# Patient Record
Sex: Male | Born: 1960 | Race: White | Hispanic: No | Marital: Married | Smoking: Former smoker
Health system: Southern US, Community
[De-identification: ages and names within clinical notes are randomized; demographics above are authoritative.]

## PROBLEM LIST (undated history)

## (undated) DIAGNOSIS — G2581 Restless legs syndrome: Secondary | ICD-10-CM

## (undated) DIAGNOSIS — E785 Hyperlipidemia, unspecified: Secondary | ICD-10-CM

## (undated) HISTORY — PX: HEMORRHOID SURGERY: SHX153

---

## 2004-05-16 ENCOUNTER — Observation Stay (HOSPITAL_COMMUNITY): Admission: EM | Admit: 2004-05-16 | Discharge: 2004-05-16 | Payer: Self-pay | Admitting: Emergency Medicine

## 2013-07-19 ENCOUNTER — Other Ambulatory Visit: Payer: Self-pay | Admitting: Family Medicine

## 2013-07-21 NOTE — Telephone Encounter (Signed)
Last seen 11/09/12  [facility]

## 2013-07-22 ENCOUNTER — Telehealth: Payer: Self-pay | Admitting: Family Medicine

## 2013-07-23 ENCOUNTER — Other Ambulatory Visit: Payer: Self-pay | Admitting: *Deleted

## 2013-07-23 MED ORDER — PRAMIPEXOLE DIHYDROCHLORIDE 1 MG PO TABS: 1.0000 mg | ORAL_TABLET | Freq: Three times a day (TID) | ORAL | Status: DC

## 2013-07-23 NOTE — Telephone Encounter (Signed)
done

## 2013-10-19 ENCOUNTER — Ambulatory Visit (INDEPENDENT_AMBULATORY_CARE_PROVIDER_SITE_OTHER): Payer: BC Managed Care – PPO | Admitting: General Practice

## 2013-10-19 ENCOUNTER — Encounter: Payer: Self-pay | Admitting: General Practice

## 2013-10-19 VITALS — BP 139/83 | HR 66 | Temp 98.3°F | Ht 72.0 in | Wt 232.5 lb

## 2013-10-19 DIAGNOSIS — G2581 Restless legs syndrome: Secondary | ICD-10-CM

## 2013-10-19 MED ORDER — PRAMIPEXOLE DIHYDROCHLORIDE 1 MG PO TABS: 1.0000 mg | ORAL_TABLET | Freq: Three times a day (TID) | ORAL | Status: DC

## 2013-10-19 NOTE — Progress Notes (Signed)
   Subjective:    Patient ID: Wesley Davenport, male    DOB: January 06, 1961, 52 y.o.   MRN: [id number]  HPI Patient presents today for follow up of restless leg syndrome. He reports taking medication as directed and symptoms are well controlled. He reports daily exercise and eating healthy.     Review of Systems  Constitutional: Negative for fever and chills.  Respiratory: Negative for chest tightness, shortness of breath and wheezing.   Cardiovascular: Negative for chest pain and palpitations.  Neurological: Negative for dizziness, weakness and headaches.       Objective:   Physical Exam  Constitutional: He is oriented to person, place, and time. He appears well-developed and well-nourished.  Cardiovascular: Normal rate, regular rhythm and normal heart sounds.   Pulmonary/Chest: Effort normal and breath sounds normal. No respiratory distress. He exhibits no tenderness.  Abdominal: Soft. Bowel sounds are normal. He exhibits no distension. There is no tenderness.  Neurological: He is alert and oriented to person, place, and time.  Skin: Skin is warm and dry.  Psychiatric: He has a normal mood and affect.          Assessment & Plan:  1. Restless leg syndrome, controlled - pramipexole (MIRAPEX) 1 MG tablet; Take 1 tablet (1 mg total) by mouth 3 (three) times daily.  Dispense: 90 tablet; Refill: 5 -Continue medications as prescribed -RTO for annual exam and labs in 3 months -Patient verbalized understanding Coralie Keens, FNP-C

## 2013-10-19 NOTE — Patient Instructions (Signed)
Restless Legs Syndrome Restless legs syndrome is a movement disorder. It may also be called a sensori-motor disorder.  CAUSES  No one knows what specifically causes restless legs syndrome, but it tends to run in families. It is also more common in people with low iron, in pregnancy, in people who need dialysis, and those with nerve damage (neuropathy).Some medications may make restless legs syndrome worse.Those medications include drugs to treat high blood pressure, some heart conditions, nausea, colds, allergies, and depression. SYMPTOMS Symptoms include uncomfortable sensations in the legs. These leg sensations are worse during periods of inactivity or rest. They are also worse while sitting or lying down. Individuals that have the disorder describe sensations in the legs that feel like:  Pulling.  Drawing.  Crawling.  Worming.  Boring.  Tingling.  Pins and needles.  Prickling.  Pain. The sensations are usually accompanied by an overwhelming urge to move the legs. Sudden muscle jerks may also occur. Movement provides temporary relief from the discomfort. In rare cases, the arms may also be affected. Symptoms may interfere with going to sleep (sleep onset insomnia). Restless legs syndrome may also be related to periodic limb movement disorder (PLMD). PLMD is another more common motor disorder. It also causes interrupted sleep. The symptoms from PLMD usually occur most often when you are awake. TREATMENT  Treatment for restless legs syndrome is symptomatic. This means that the symptoms are treated.   Massage and cold compresses may provide temporary relief.  Walk, stretch, or take a cold or hot bath.  Get regular exercise and a good night's sleep.  Avoid caffeine, alcohol, nicotine, and medications that can make it worse.  Do activities that provide mental stimulation like discussions, needlework, and video games. These may be helpful if you are not able to walk or  stretch. Some medications are effective in relieving the symptoms. However, many of these medications have side effects. Ask your caregiver about medications that may help your symptoms. Correcting iron deficiency may improve symptoms for some patients. Document Released: 10/18/2002 Document Revised: 01/20/2012 Document Reviewed: 01/24/2011 [employer] Patient Information 2014 [employer].  

## 2014-03-15 ENCOUNTER — Other Ambulatory Visit: Payer: Self-pay | Admitting: General Practice

## 2014-11-16 ENCOUNTER — Other Ambulatory Visit: Payer: Self-pay | Admitting: General Practice

## 2015-01-30 ENCOUNTER — Other Ambulatory Visit: Payer: Self-pay | Admitting: General Practice

## 2015-02-10 ENCOUNTER — Ambulatory Visit (INDEPENDENT_AMBULATORY_CARE_PROVIDER_SITE_OTHER): Payer: 59 | Admitting: Family Medicine

## 2015-02-10 VITALS — BP 136/82 | HR 67 | Temp 97.6°F | Ht 72.0 in | Wt 231.2 lb

## 2015-02-10 DIAGNOSIS — G2581 Restless legs syndrome: Secondary | ICD-10-CM | POA: Diagnosis not present

## 2015-02-10 MED ORDER — PRAMIPEXOLE DIHYDROCHLORIDE 1 MG PO TABS: 1.0000 mg | ORAL_TABLET | Freq: Three times a day (TID) | ORAL | Status: DC

## 2015-02-10 MED ORDER — CARBIDOPA-LEVODOPA ER 50-200 MG PO TBCR: 1.0000 | EXTENDED_RELEASE_TABLET | Freq: Two times a day (BID) | ORAL | Status: DC

## 2015-02-10 MED ORDER — MAGNESIUM 400 MG PO CAPS: 400.0000 mg | ORAL_CAPSULE | Freq: Two times a day (BID) | ORAL | Status: DC

## 2015-02-10 NOTE — Progress Notes (Signed)
Subjective:  Patient ID: Wesley Davenport, male    DOB: 1961/07/10  Age: 54 y.o. MRN: [id number]  CC: RLS   HPI Wesley Davenport presents for restless legs when sitting during day. No problem in morning. Mom has it.No relief with OTC & herbals. Mira due to drowsiness.pex works. Causes drowsiness. Using TID. Can't drive  History Wesley Davenport has no past medical history on file.   Wesley Davenport has past surgical history that includes Hemorrhoid surgery. herbal.  His family history includes Cancer in his mother; Heart disease in his brother and father.Wesley Davenport reports that Wesley Davenport has never smoked. Wesley Davenport does not have any smokeless tobacco history on file. Wesley Davenport reports that Wesley Davenport drinks alcohol. Wesley Davenport reports that Wesley Davenport does not use illicit drugs.  No current outpatient prescriptions on file prior to visit.   No current facility-administered medications on file prior to visit.    ROS Review of Systems  Constitutional: Negative for fever, chills, diaphoresis and unexpected weight change.  HENT: Negative for congestion, hearing loss, rhinorrhea, sore throat and trouble swallowing.   Respiratory: Negative for cough, chest tightness, shortness of breath and wheezing.   Gastrointestinal: Negative for nausea, vomiting, abdominal pain, diarrhea, constipation and abdominal distention.  Endocrine: Negative for cold intolerance and heat intolerance.  Genitourinary: Negative for dysuria, hematuria and flank pain.  Musculoskeletal: Negative for joint swelling and arthralgias.  Skin: Negative for rash.  Neurological: Negative for dizziness and headaches.  Psychiatric/Behavioral: Negative for dysphoric mood, decreased concentration and agitation. The patient is not nervous/anxious.     Objective:  BP 136/82 mmHg  Pulse 67  Temp(Src) 97.6 F (36.4 C) (Oral)  Ht 6' (1.829 m)  Wt 231 lb 3.2 oz (104.872 kg)  BMI 31.35 kg/m2  BP Readings from Last 3 Encounters:  02/10/15 136/82  10/19/13 139/83    Wt Readings from Last 3 Encounters:    02/10/15 231 lb 3.2 oz (104.872 kg)  10/19/13 232 lb 8 oz (105.461 kg)     Physical Exam  Constitutional: Wesley Davenport is oriented to person, place, and time. Wesley Davenport appears well-developed and well-nourished. No distress.  HENT:  Head: Normocephalic and atraumatic.  Right Ear: External ear normal.  Left Ear: External ear normal.  Nose: Nose normal.  Mouth/Throat: Oropharynx is clear and moist.  Eyes: Conjunctivae and EOM are normal. Pupils are equal, round, and reactive to light.  Neck: Normal range of motion. Neck supple. No thyromegaly present.  Cardiovascular: Normal rate, regular rhythm and normal heart sounds.   No murmur heard. Pulmonary/Chest: Effort normal and breath sounds normal. No respiratory distress. Wesley Davenport has no wheezes. Wesley Davenport has no rales.  Abdominal: Soft. Bowel sounds are normal. Wesley Davenport exhibits no distension. There is no tenderness.  Lymphadenopathy:    Wesley Davenport has no cervical adenopathy.  Neurological: Wesley Davenport is alert and oriented to person, place, and time. Wesley Davenport has normal reflexes.  Skin: Skin is warm and dry.  Psychiatric: Wesley Davenport has a normal mood and affect. His behavior is normal. Judgment and thought content normal.    No results found for: HGBA1C  No results found for: WBC, HGB, HCT, PLT, GLUCOSE, CHOL, TRIG, HDL, LDLDIRECT, LDLCALC, ALT, AST, NA, K, CL, CREATININE, BUN, CO2, TSH, PSA, INR, GLUF, HGBA1C, MICROALBUR  No results found.  Assessment & Plan:   Jair was seen today for rls.  Diagnoses and all orders for this visit:  Restless leg syndrome, controlled Orders: -     Discontinue: pramipexole (MIRAPEX) 1 MG tablet; Take 1 tablet (1 mg total) by mouth 3 (  three) times daily. -     Discontinue: pramipexole (MIRAPEX) 1 MG tablet; Take 1 tablet (1 mg total) by mouth 3 (three) times daily. -     pramipexole (MIRAPEX) 1 MG tablet; Take 1 tablet (1 mg total) by mouth 3 (three) times daily. -     CMP14+EGFR -     Magnesium  Other orders -     Magnesium 400 MG CAPS; Take 400 mg by  mouth 2 (two) times daily. -     carbidopa-levodopa (SINEMET CR) 50-200 MG per tablet; Take 1 tablet by mouth 2 (two) times daily.   I am having Wesley Davenport start on Magnesium and carbidopa-levodopa. I am also having him maintain his pramipexole.  Meds ordered this encounter  Medications  . DISCONTD: pramipexole (MIRAPEX) 1 MG tablet    Sig: Take 1 tablet (1 mg total) by mouth 3 (three) times daily.    Dispense:  90 tablet    Refill:  5  . Magnesium 400 MG CAPS    Sig: Take 400 mg by mouth 2 (two) times daily.    Dispense:  60 capsule    Refill:  11  . DISCONTD: pramipexole (MIRAPEX) 1 MG tablet    Sig: Take 1 tablet (1 mg total) by mouth 3 (three) times daily.    Dispense:  90 tablet    Refill:  5  . carbidopa-levodopa (SINEMET CR) 50-200 MG per tablet    Sig: Take 1 tablet by mouth 2 (two) times daily.    Dispense:  60 tablet    Refill:  5  . pramipexole (MIRAPEX) 1 MG tablet    Sig: Take 1 tablet (1 mg total) by mouth 3 (three) times daily.    Dispense:  90 tablet    Refill:  11     Follow-up: Return in about 1 year (around 02/10/2016) for CPE.  Claretta Fraise, M.D.

## 2015-02-11 ENCOUNTER — Telehealth: Payer: Self-pay | Admitting: Family Medicine

## 2015-02-11 LAB — CMP14+EGFR
ALK PHOS: 81 IU/L (ref 39–117)
ALT: 25 IU/L (ref 0–44)
AST: 20 IU/L (ref 0–40)
Albumin/Globulin Ratio: 1.8 (ref 1.1–2.5)
Albumin: 4.4 g/dL (ref 3.5–5.5)
BUN/Creatinine Ratio: 11 (ref 9–20)
BUN: 9 mg/dL (ref 6–24)
Bilirubin Total: 0.3 mg/dL (ref 0.0–1.2)
CO2: 23 mmol/L (ref 18–29)
Calcium: 9.2 mg/dL (ref 8.7–10.2)
Chloride: 101 mmol/L (ref 97–108)
Creatinine, Ser: 0.83 mg/dL (ref 0.76–1.27)
GFR calc non Af Amer: 99 mL/min/{1.73_m2} (ref >59)
GFR, EST AFRICAN AMERICAN: 115 mL/min/{1.73_m2} (ref >59)
Globulin, Total: 2.4 g/dL (ref 1.5–4.5)
Glucose: 102 mg/dL — ABNORMAL HIGH (ref 65–99)
POTASSIUM: 4.5 mmol/L (ref 3.5–5.2)
SODIUM: 140 mmol/L (ref 134–144)
Total Protein: 6.8 g/dL (ref 6.0–8.5)

## 2015-02-11 LAB — MAGNESIUM: Magnesium: 2.1 mg/dL (ref 1.6–2.3)

## 2015-02-11 NOTE — Telephone Encounter (Signed)
[facility] was wanting to change patients magnesium capsules to tablets ok per Christy to change.

## 2016-03-22 ENCOUNTER — Other Ambulatory Visit: Payer: Self-pay | Admitting: Family Medicine

## 2016-03-27 ENCOUNTER — Other Ambulatory Visit: Payer: Self-pay | Admitting: Family Medicine

## 2016-03-27 ENCOUNTER — Ambulatory Visit (INDEPENDENT_AMBULATORY_CARE_PROVIDER_SITE_OTHER): Payer: BLUE CROSS/BLUE SHIELD | Admitting: Family Medicine

## 2016-03-27 ENCOUNTER — Encounter: Payer: Self-pay | Admitting: Family Medicine

## 2016-03-27 VITALS — BP 140/83 | HR 82 | Temp 97.2°F | Ht 72.0 in | Wt 230.4 lb

## 2016-03-27 DIAGNOSIS — G2581 Restless legs syndrome: Secondary | ICD-10-CM | POA: Diagnosis not present

## 2016-03-27 MED ORDER — CARBIDOPA-LEVODOPA ER 50-200 MG PO TBCR: 1.0000 | EXTENDED_RELEASE_TABLET | Freq: Two times a day (BID) | ORAL | Status: DC

## 2016-03-27 MED ORDER — PRAMIPEXOLE DIHYDROCHLORIDE 1 MG PO TABS: 1.0000 mg | ORAL_TABLET | Freq: Three times a day (TID) | ORAL | Status: DC

## 2016-03-27 NOTE — Progress Notes (Signed)
   Subjective:  Patient ID: Wesley Davenport, male    DOB: 12/13/1960  Age: 55 y.o. MRN: [id number]  CC: RLS   HPI Wesley Davenport presents for still has symptoms. Getting in arms too. Does't want increased treatment. Feels he should tolerate as is to avoid side effects such as drowsiness. Mom  Has it and he saw she had to deal with it. He isopen to new treatmentsif something else comes on th emarket.   History Wesley Davenport has no past medical history on file.   He has past surgical history that includes Hemorrhoid surgery.   His family history includes Cancer in his mother; Heart disease in his brother and father.He reports that he has never smoked. He does not have any smokeless tobacco history on file. He reports that he drinks alcohol. He reports that he does not use illicit drugs.    ROS Review of Systems  Constitutional: Negative for fever, chills and diaphoresis.  HENT: Negative for rhinorrhea and sore throat.   Respiratory: Negative for cough and shortness of breath.   Cardiovascular: Negative for chest pain.  Gastrointestinal: Negative for abdominal pain.  Musculoskeletal: Positive for myalgias.  Skin: Negative for rash.  Neurological: Negative for weakness and headaches.    Objective:  BP 140/83 mmHg  Pulse 82  Temp(Src) 97.2 F (36.2 C) (Oral)  Ht 6' (1.829 m)  Wt 230 lb 6.4 oz (104.509 kg)  BMI 31.24 kg/m2  SpO2 96%  BP Readings from Last 3 Encounters:  03/27/16 140/83  02/10/15 136/82  10/19/13 139/83    Wt Readings from Last 3 Encounters:  03/27/16 230 lb 6.4 oz (104.509 kg)  02/10/15 231 lb 3.2 oz (104.872 kg)  10/19/13 232 lb 8 oz (105.461 kg)     Physical Exam  Constitutional: He appears well-developed and well-nourished.  HENT:  Head: Normocephalic and atraumatic.  Right Ear: Tympanic membrane and external ear normal. No decreased hearing is noted.  Left Ear: Tympanic membrane and external ear normal. No decreased hearing is noted.  Mouth/Throat: No  oropharyngeal exudate or posterior oropharyngeal erythema.  Eyes: Pupils are equal, round, and reactive to light.  Neck: Normal range of motion. Neck supple.  Cardiovascular: Normal rate and regular rhythm.   No murmur heard. Pulmonary/Chest: Breath sounds normal. No respiratory distress.  Abdominal: Soft. Bowel sounds are normal. He exhibits no mass. There is no tenderness.  Vitals reviewed.    Lab Results  Component Value Date   GLUCOSE 102* 02/10/2015   ALT 25 02/10/2015   AST 20 02/10/2015   NA 140 02/10/2015   K 4.5 02/10/2015   CL 101 02/10/2015   CREATININE 0.83 02/10/2015   BUN 9 02/10/2015   CO2 23 02/10/2015    No results found.  Assessment & Plan:   Wesley Davenport was seen today for rls.  Diagnoses and all orders for this visit:  Restless leg syndrome, controlled     I am having Wesley Davenport maintain his Magnesium, carbidopa-levodopa, and pramipexole.  No orders of the defined types were placed in this encounter.     Follow-up: Return in about 1 year (around 03/27/2017) for CPE.  Wesley Davenport, M.D.

## 2016-04-12 ENCOUNTER — Ambulatory Visit (INDEPENDENT_AMBULATORY_CARE_PROVIDER_SITE_OTHER): Payer: BLUE CROSS/BLUE SHIELD | Admitting: Family

## 2016-04-12 ENCOUNTER — Encounter: Payer: Self-pay | Admitting: Family

## 2016-04-12 VITALS — BP 131/84 | HR 74 | Temp 98.9°F | Ht 72.0 in | Wt 225.8 lb

## 2016-04-12 DIAGNOSIS — J209 Acute bronchitis, unspecified: Secondary | ICD-10-CM | POA: Diagnosis not present

## 2016-04-12 MED ORDER — FLUTICASONE PROPIONATE 50 MCG/ACT NA SUSP: 2.0000 | Freq: Every day | NASAL | Status: DC

## 2016-04-12 MED ORDER — AZITHROMYCIN 250 MG PO TABS: ORAL_TABLET | ORAL | Status: DC

## 2016-04-12 NOTE — Patient Instructions (Addendum)
Acute Bronchitis Bronchitis is inflammation of the airways that extend from the windpipe into the lungs (bronchi). The inflammation often causes mucus to develop. This leads to a cough, which is the most common symptom of bronchitis.  In acute bronchitis, the condition usually develops suddenly and goes away over time, usually in a couple weeks. Smoking, allergies, and asthma can make bronchitis worse. Repeated episodes of bronchitis may cause further lung problems.  CAUSES Acute bronchitis is most often caused by the same virus that causes a cold. The virus can spread from person to person (contagious) through coughing, sneezing, and touching contaminated objects. SIGNS AND SYMPTOMS   Cough.   Fever.   Coughing up mucus.   Body aches.   Chest congestion.   Chills.   Shortness of breath.   Sore throat.  DIAGNOSIS  Acute bronchitis is usually diagnosed through a physical exam. Your health care provider will also ask you questions about your medical history. Tests, such as chest X-rays, are sometimes done to rule out other conditions.  TREATMENT  Acute bronchitis usually goes away in a couple weeks. Oftentimes, no medical treatment is necessary. Medicines are sometimes given for relief of fever or cough. Antibiotic medicines are usually not needed but may be prescribed in certain situations. In some cases, an inhaler may be recommended to help reduce shortness of breath and control the cough. A cool mist vaporizer may also be used to help thin bronchial secretions and make it easier to clear the chest.  HOME CARE INSTRUCTIONS  Get plenty of rest.   Drink enough fluids to keep your urine clear or pale yellow (unless you have a medical condition that requires fluid restriction). Increasing fluids may help thin your respiratory secretions (sputum) and reduce chest congestion, and it will prevent dehydration.   Take medicines only as directed by your health care provider.  If  you were prescribed an antibiotic medicine, finish it all even if you start to feel better.  Avoid smoking and secondhand smoke. Exposure to cigarette smoke or irritating chemicals will make bronchitis worse. If you are a smoker, consider using nicotine gum or skin patches to help control withdrawal symptoms. Quitting smoking will help your lungs heal faster.   Reduce the chances of another bout of acute bronchitis by washing your hands frequently, avoiding people with cold symptoms, and trying not to touch your hands to your mouth, nose, or eyes.   Keep all follow-up visits as directed by your health care provider.  SEEK MEDICAL CARE IF: Your symptoms do not improve after 1 week of treatment.  SEEK IMMEDIATE MEDICAL CARE IF:  You develop an increased fever or chills.   You have chest pain.   You have severe shortness of breath.  You have bloody sputum.   You develop dehydration.  You faint or repeatedly feel like you are going to pass out.  You develop repeated vomiting.  You develop a severe headache. MAKE SURE YOU:   Understand these instructions.  Will watch your condition.  Will get help right away if you are not doing well or get worse.   This information is not intended to replace advice given to you by your health care provider. Make sure you discuss any questions you have with your health care provider.   Document Released: 12/05/2004 Document Revised: 11/18/2014 Document Reviewed: 04/20/2013 Elsevier Interactive Patient Education 2016 [employer]  - Take meds as prescribed - Use a cool mist humidifier  -Use saline nose sprays frequently -Saline   irrigations of the nose can be very helpful if done frequently.  * 4X daily for 1 week*  * Use of a nettie pot can be helpful with this. Follow directions with this* -Force fluids -For any cough or congestion  Use plain Mucinex- regular strength or max strength is fine   * Children- consult with Pharmacist for  dosing -For fever or aces or pains- take tylenol or ibuprofen appropriate for age and weight.  * for fevers greater than 101 orally you may alternate ibuprofen and tylenol every  3 hours. -Throat lozenges if help    Jeliyah Middlebrooks, FNP  

## 2016-04-12 NOTE — Progress Notes (Signed)
   Subjective:    Patient ID: Wesley Davenport, male    DOB: [birth year], 55 y.o.   MRN: [id number]  Cough This is a new problem. The current episode started 1 to 4 weeks ago. The problem has been gradually worsening. The problem occurs every few minutes. The cough is productive of sputum. Associated symptoms include nasal congestion, postnasal drip, rhinorrhea, a sore throat, shortness of breath and wheezing. Pertinent negatives include no ear congestion, ear pain, fever or myalgias. The symptoms are aggravated by lying down. He has tried rest and OTC cough suppressant for the symptoms. The treatment provided mild relief. There is no history of asthma or COPD.      Review of Systems  Constitutional: Negative for fever.  HENT: Positive for postnasal drip, rhinorrhea and sore throat. Negative for ear pain.   Respiratory: Positive for cough, shortness of breath and wheezing.   Musculoskeletal: Negative for myalgias.  All other systems reviewed and are negative.      Objective:   Physical Exam  Constitutional: He is oriented to person, place, and time. He appears well-developed and well-nourished. No distress.  HENT:  Head: Normocephalic.  Right Ear: External ear normal.  Left Ear: External ear normal.  Nose: Rhinorrhea present.  Mouth/Throat: Oropharynx is clear and moist.  Eyes: Pupils are equal, round, and reactive to light. Right eye exhibits no discharge. Left eye exhibits no discharge.  Neck: Normal range of motion. Neck supple. No thyromegaly present.  Cardiovascular: Normal rate, regular rhythm, normal heart sounds and intact distal pulses.   No murmur heard. Pulmonary/Chest: Effort normal and breath sounds normal. No respiratory distress. He has no wheezes.  Coarse nonproductive cough   Abdominal: Soft. Bowel sounds are normal. He exhibits no distension. There is no tenderness.  Musculoskeletal: Normal range of motion. He exhibits no edema or tenderness.  Neurological: He is  alert and oriented to person, place, and time. He has normal reflexes. No cranial nerve deficit.  Skin: Skin is warm and dry. No rash noted. No erythema.  Psychiatric: He has a normal mood and affect. His behavior is normal. Judgment and thought content normal.  Vitals reviewed.     BP 131/84 mmHg  Pulse 74  Temp(Src) 98.9 F (37.2 C) (Oral)  Ht 6' (1.829 m)  Wt 225 lb 12.8 oz (102.422 kg)  BMI 30.62 kg/m2     Assessment & Plan:  1. Acute bronchitis, unspecified organism -- Take meds as prescribed - Use a cool mist humidifier  -Use saline nose sprays frequently -Saline irrigations of the nose can be very helpful if done frequently.  * 4X daily for 1 week*  * Use of a nettie pot can be helpful with this. Follow directions with this* -Force fluids -For any cough or congestion  Use plain Mucinex- regular strength or max strength is fine   * Children- consult with Pharmacist for dosing -For fever or aces or pains- take tylenol or ibuprofen appropriate for age and weight.  * for fevers greater than 101 orally you may alternate ibuprofen and tylenol every  3 hours. -Throat lozenges if helps - azithromycin (ZITHROMAX Z-PAK) 250 MG tablet; As directed  Dispense: 1 each; Refill: 0 - fluticasone (FLONASE) 50 MCG/ACT nasal spray; Place 2 sprays into both nostrils daily.  Dispense: 16 g; Refill: 6  Jannifer Rodneyhristy Amrie Gurganus, FNP

## 2016-04-22 ENCOUNTER — Ambulatory Visit (INDEPENDENT_AMBULATORY_CARE_PROVIDER_SITE_OTHER): Payer: BLUE CROSS/BLUE SHIELD | Admitting: Family Medicine

## 2016-04-22 ENCOUNTER — Encounter: Payer: Self-pay | Admitting: Family Medicine

## 2016-04-22 VITALS — BP 128/80 | HR 70 | Temp 98.4°F | Ht 72.0 in | Wt 225.0 lb

## 2016-04-22 DIAGNOSIS — J209 Acute bronchitis, unspecified: Secondary | ICD-10-CM | POA: Diagnosis not present

## 2016-04-22 MED ORDER — LEVOFLOXACIN 500 MG PO TABS: 500.0000 mg | ORAL_TABLET | Freq: Every day | ORAL | Status: DC

## 2016-04-22 NOTE — Addendum Note (Signed)
Addended by: Magdalene RiverBULLINS, Shreena Baines H on: 04/22/2016 02:50 PM   Modules accepted: Orders

## 2016-04-22 NOTE — Progress Notes (Signed)
   Subjective:    Patient ID: Wesley Davenport, male    DOB: [birth year], 55 y.o.   MRN: [id number]  HPI Patient here today for continued cough and congestion. He has completes a antibiotic and is not feeling any better. He took a Z-Pak and continues to take Mucinex and Flonase. Cough is mostly productive.    There are no active problems to display for this patient.  Outpatient Encounter Prescriptions as of 04/22/2016  Medication Sig  . carbidopa-levodopa (SINEMET CR) 50-200 MG tablet Take 1 tablet by mouth 2 (two) times daily.  . fluticasone (FLONASE) 50 MCG/ACT nasal spray Place 2 sprays into both nostrils daily.  . Magnesium 400 MG CAPS Take 400 mg by mouth 2 (two) times daily.  . pramipexole (MIRAPEX) 1 MG tablet Take 1 tablet (1 mg total) by mouth 3 (three) times daily.  . [DISCONTINUED] azithromycin (ZITHROMAX Z-PAK) 250 MG tablet As directed   No facility-administered encounter medications on file as of 04/22/2016.      Review of Systems  Constitutional: Positive for fatigue. Negative for fever.  HENT: Positive for congestion (chest and nasal).   Eyes: Negative.   Respiratory: Positive for cough.   Cardiovascular: Negative.   Gastrointestinal: Negative.   Endocrine: Negative.   Genitourinary: Negative.   Musculoskeletal: Negative.   Skin: Negative.   Allergic/Immunologic: Negative.   Neurological: Negative.   Hematological: Negative.   Psychiatric/Behavioral: Negative.        Objective:   Physical Exam  Constitutional: He is oriented to person, place, and time. He appears well-developed and well-nourished.  HENT:  Sinuses are nontender  Cardiovascular: Normal rate, regular rhythm and normal heart sounds.   Pulmonary/Chest: Effort normal. He has wheezes.  Neurological: He is alert and oriented to person, place, and time.  Psychiatric: He has a normal mood and affect. His behavior is normal.   BP 128/80 mmHg  Pulse 70  Temp(Src) 98.4 F (36.9 C) (Oral)  Ht 6'  (1.829 m)  Wt 225 lb (102.059 kg)  BMI 30.51 kg/m2  SpO2 94%        Assessment & Plan:  1. Acute bronchitis, unspecified organism By history, no improvement on Zithromax. We may not be dealing with a bacterial infection but will try a 5 day course of Levaquin 500 mg. Also sample of inhaler with steroids in case there is inflammation and is bronchial system. Continue Mucinex and drink lots of fluids  Frederica KusterStephen M Ananda Sitzer MD

## 2017-05-26 ENCOUNTER — Other Ambulatory Visit: Payer: Self-pay | Admitting: Family Medicine

## 2017-05-26 DIAGNOSIS — G2581 Restless legs syndrome: Secondary | ICD-10-CM

## 2017-05-29 ENCOUNTER — Other Ambulatory Visit: Payer: Self-pay | Admitting: Family Medicine

## 2017-05-29 DIAGNOSIS — G2581 Restless legs syndrome: Secondary | ICD-10-CM

## 2017-06-02 ENCOUNTER — Other Ambulatory Visit: Payer: Self-pay | Admitting: Family Medicine

## 2017-06-02 DIAGNOSIS — G2581 Restless legs syndrome: Secondary | ICD-10-CM

## 2017-08-13 ENCOUNTER — Ambulatory Visit (INDEPENDENT_AMBULATORY_CARE_PROVIDER_SITE_OTHER): Payer: BLUE CROSS/BLUE SHIELD | Admitting: Family Medicine

## 2017-08-13 ENCOUNTER — Encounter: Payer: Self-pay | Admitting: Family Medicine

## 2017-08-13 VITALS — BP 117/67 | HR 80 | Temp 98.2°F | Ht 72.0 in | Wt 227.0 lb

## 2017-08-13 DIAGNOSIS — Z125 Encounter for screening for malignant neoplasm of prostate: Secondary | ICD-10-CM | POA: Diagnosis not present

## 2017-08-13 DIAGNOSIS — F17210 Nicotine dependence, cigarettes, uncomplicated: Secondary | ICD-10-CM

## 2017-08-13 DIAGNOSIS — R6882 Decreased libido: Secondary | ICD-10-CM | POA: Diagnosis not present

## 2017-08-13 DIAGNOSIS — G2581 Restless legs syndrome: Secondary | ICD-10-CM | POA: Diagnosis not present

## 2017-08-13 DIAGNOSIS — Z1159 Encounter for screening for other viral diseases: Secondary | ICD-10-CM | POA: Diagnosis not present

## 2017-08-13 DIAGNOSIS — Z13220 Encounter for screening for lipoid disorders: Secondary | ICD-10-CM | POA: Diagnosis not present

## 2017-08-13 MED ORDER — PRAMIPEXOLE DIHYDROCHLORIDE 1 MG PO TABS: 1.0000 mg | ORAL_TABLET | Freq: Three times a day (TID) | ORAL | 1 refills | Status: DC

## 2017-08-13 MED ORDER — VARENICLINE TARTRATE 0.5 MG X 11 & 1 MG X 42 PO MISC: ORAL | 0 refills | Status: DC

## 2017-08-13 MED ORDER — CARBIDOPA-LEVODOPA ER 50-200 MG PO TBCR: 1.0000 | EXTENDED_RELEASE_TABLET | Freq: Two times a day (BID) | ORAL | 1 refills | Status: DC

## 2017-08-13 NOTE — Progress Notes (Signed)
Subjective:  Patient ID: Wesley Davenport, male    DOB: [birth year]  Age: 56 y.o. MRN: [id number]  CC: Medication Management (pt here today for routine follow up on Restless leg syndrome and also to discuss Chantix as he would like to quit smoking.)   HPI Wesley Davenport presents for Follow-up on his restless leg syndrome. He still having some symptoms but they're controllable. He says he doesn't really have pain but he does have cramping every few days or so. It does not interfere with activities nor does it keep him awake at night. He has some questions about switching to gabapentin. He was told by a doctor friend that it might work better. On the other hand he is satisfied with his treatment currently and does not necessarily want to change. Additionally patient would like to stop smoking and is interested in using Chantix.  Patient is also concerned that his libido has been falling off. His wife has been commenting to in and he would like to be checked. He is considering use of Viagra but the problem is not lack of erections as much as lack of desire.  Patient has never been checked for hepatitis C. He also is overdue for a PSA level. Those will be checked today as well.  Depression screen [facility] 2/9 08/13/2017 04/22/2016 04/12/2016  Decreased Interest 0 0 0  Down, Depressed, Hopeless 0 0 0  PHQ - 2 Score 0 0 0    History [facility] has no past medical history on file.   He has a past surgical history that includes Hemorrhoid surgery.   His family history includes Cancer in his mother; Heart disease in his brother and father.He reports that he has been smoking.  He has never used smokeless tobacco. He reports that he drinks alcohol. He reports that he does not use drugs.    ROS Review of Systems  Constitutional: Negative for chills, diaphoresis, fever and unexpected weight change.  HENT: Negative for congestion, hearing loss, rhinorrhea and sore throat.   Eyes: Negative for visual disturbance.    Respiratory: Negative for cough and shortness of breath.   Cardiovascular: Negative for chest pain.  Gastrointestinal: Negative for abdominal pain, constipation and diarrhea.  Genitourinary: Negative for dysuria and flank pain.  Musculoskeletal: Negative for arthralgias and joint swelling.  Skin: Negative for rash.  Neurological: Negative for dizziness and headaches.  Psychiatric/Behavioral: Negative for dysphoric mood and sleep disturbance.    Objective:  BP 117/67   Pulse 80   Temp 98.2 F (36.8 C) (Oral)   Ht 6' (1.829 m)   Wt 227 lb (103 kg)   BMI 30.79 kg/m   BP Readings from Last 3 Encounters:  08/13/17 117/67  04/22/16 128/80  04/12/16 131/84    Wt Readings from Last 3 Encounters:  08/13/17 227 lb (103 kg)  04/22/16 225 lb (102.1 kg)  04/12/16 225 lb 12.8 oz (102.4 kg)     Physical Exam  Constitutional: He is oriented to person, place, and time. He appears well-developed and well-nourished. No distress.  HENT:  Head: Normocephalic and atraumatic.  Right Ear: External ear normal.  Left Ear: External ear normal.  Nose: Nose normal.  Mouth/Throat: Oropharynx is clear and moist.  Eyes: Pupils are equal, round, and reactive to light. Conjunctivae and EOM are normal.  Neck: Normal range of motion. Neck supple. No thyromegaly present.  Cardiovascular: Normal rate, regular rhythm and normal heart sounds.   No murmur heard. Pulmonary/Chest: Effort normal and breath sounds normal.  No respiratory distress. He has no wheezes. He has no rales.  Abdominal: Soft. Bowel sounds are normal. He exhibits no distension. There is no tenderness.  Lymphadenopathy:    He has no cervical adenopathy.  Neurological: He is alert and oriented to person, place, and time. He has normal reflexes.  Skin: Skin is warm and dry.  Psychiatric: He has a normal mood and affect. His behavior is normal. Judgment and thought content normal.      Assessment & Plan:   Wesley Davenport was seen today for  medication management.  Diagnoses and all orders for this visit:  Need for hepatitis C screening test -     Hepatitis C antibody  Restless leg syndrome, controlled -     CBC with Differential/Platelet -     CMP14+EGFR -     pramipexole (MIRAPEX) 1 MG tablet; Take 1 tablet (1 mg total) by mouth 3 (three) times daily. -     carbidopa-levodopa (SINEMET CR) 50-200 MG tablet; Take 1 tablet by mouth 2 (two) times daily.  Decreased libido -     CBC with Differential/Platelet -     CMP14+EGFR -     Testosterone,Free and Total  Screening for prostate cancer -     PSA, total and free  Screening for cholesterol level -     Lipid panel  Tobacco dependence due to cigarettes -     varenicline (CHANTIX STARTING MONTH PAK) 0.5 MG X 11 & 1 MG X 42 tablet; Use according to package directions       I have discontinued Wesley Davenport's Magnesium, fluticasone, and levofloxacin. I have also changed his pramipexole and carbidopa-levodopa. Additionally, I am having him start on varenicline.  Allergies as of 08/13/2017   No Known Allergies     Medication List       Accurate as of 08/13/17  6:06 PM. Always use your most recent med list.          carbidopa-levodopa 50-200 MG tablet Commonly known as:  SINEMET CR Take 1 tablet by mouth 2 (two) times daily.   pramipexole 1 MG tablet Commonly known as:  MIRAPEX Take 1 tablet (1 mg total) by mouth 3 (three) times daily.   varenicline 0.5 MG X 11 & 1 MG X 42 tablet Commonly known as:  CHANTIX STARTING MONTH PAK Use according to package directions        Follow-up: Return in about 6 months (around 02/11/2018).  Claretta Fraise, M.D.

## 2017-08-14 LAB — CBC WITH DIFFERENTIAL/PLATELET
Basophils Absolute: 0 10*3/uL (ref 0.0–0.2)
Basos: 0 %
EOS (ABSOLUTE): 0.2 10*3/uL (ref 0.0–0.4)
EOS: 2 %
HEMOGLOBIN: 16.6 g/dL (ref 13.0–17.7)
Hematocrit: 50.3 % (ref 37.5–51.0)
IMMATURE GRANS (ABS): 0.1 10*3/uL (ref 0.0–0.1)
Immature Granulocytes: 1 %
LYMPHS ABS: 2.4 10*3/uL (ref 0.7–3.1)
LYMPHS: 22 %
MCH: 30.5 pg (ref 26.6–33.0)
MCHC: 33 g/dL (ref 31.5–35.7)
MCV: 92 fL (ref 79–97)
MONOCYTES: 9 %
Monocytes Absolute: 1 10*3/uL — ABNORMAL HIGH (ref 0.1–0.9)
NEUTROS ABS: 7.2 10*3/uL — AB (ref 1.4–7.0)
Neutrophils: 66 %
Platelets: 277 10*3/uL (ref 150–379)
RBC: 5.45 x10E6/uL (ref 4.14–5.80)
RDW: 14.1 % (ref 12.3–15.4)
WBC: 10.8 10*3/uL (ref 3.4–10.8)

## 2017-08-14 LAB — TESTOSTERONE,FREE AND TOTAL
TESTOSTERONE FREE: 7.9 pg/mL (ref 7.2–24.0)
Testosterone: 502 ng/dL (ref 264–916)

## 2017-08-14 LAB — CMP14+EGFR
ALK PHOS: 76 IU/L (ref 39–117)
ALT: 31 IU/L (ref 0–44)
AST: 24 IU/L (ref 0–40)
Albumin/Globulin Ratio: 1.7 (ref 1.2–2.2)
Albumin: 4.3 g/dL (ref 3.5–5.5)
BUN/Creatinine Ratio: 9 (ref 9–20)
BUN: 9 mg/dL (ref 6–24)
Bilirubin Total: 0.3 mg/dL (ref 0.0–1.2)
CO2: 24 mmol/L (ref 20–29)
CREATININE: 0.98 mg/dL (ref 0.76–1.27)
Calcium: 9.6 mg/dL (ref 8.7–10.2)
Chloride: 104 mmol/L (ref 96–106)
GFR calc Af Amer: 98 mL/min/{1.73_m2} (ref >59)
GFR calc non Af Amer: 85 mL/min/{1.73_m2} (ref >59)
GLUCOSE: 77 mg/dL (ref 65–99)
Globulin, Total: 2.6 g/dL (ref 1.5–4.5)
Potassium: 4.8 mmol/L (ref 3.5–5.2)
Sodium: 142 mmol/L (ref 134–144)
Total Protein: 6.9 g/dL (ref 6.0–8.5)

## 2017-08-14 LAB — PSA, TOTAL AND FREE
PSA FREE: 0.13 ng/mL
PSA, Free Pct: 14.4 %
Prostate Specific Ag, Serum: 0.9 ng/mL (ref 0.0–4.0)

## 2017-08-14 LAB — LIPID PANEL
CHOLESTEROL TOTAL: 205 mg/dL — AB (ref 100–199)
Chol/HDL Ratio: 4.4 ratio (ref 0.0–5.0)
HDL: 47 mg/dL (ref >39)
LDL CALC: 142 mg/dL — AB (ref 0–99)
TRIGLYCERIDES: 79 mg/dL (ref 0–149)
VLDL CHOLESTEROL CAL: 16 mg/dL (ref 5–40)

## 2017-08-14 LAB — HEPATITIS C ANTIBODY

## 2017-08-29 ENCOUNTER — Telehealth: Payer: Self-pay | Admitting: Family Medicine

## 2017-08-29 NOTE — Telephone Encounter (Signed)
Labs at the front

## 2018-02-11 ENCOUNTER — Ambulatory Visit: Payer: BLUE CROSS/BLUE SHIELD | Admitting: Family Medicine

## 2018-10-27 ENCOUNTER — Encounter: Payer: Self-pay | Admitting: Family

## 2018-10-27 ENCOUNTER — Encounter (INDEPENDENT_AMBULATORY_CARE_PROVIDER_SITE_OTHER): Payer: Self-pay

## 2018-10-27 ENCOUNTER — Ambulatory Visit: Payer: BLUE CROSS/BLUE SHIELD | Admitting: Family

## 2018-10-27 VITALS — BP 123/77 | HR 69 | Temp 97.7°F | Ht 72.0 in | Wt 233.0 lb

## 2018-10-27 DIAGNOSIS — F17200 Nicotine dependence, unspecified, uncomplicated: Secondary | ICD-10-CM | POA: Diagnosis not present

## 2018-10-27 DIAGNOSIS — G2581 Restless legs syndrome: Secondary | ICD-10-CM | POA: Insufficient documentation

## 2018-10-27 DIAGNOSIS — Z0000 Encounter for general adult medical examination without abnormal findings: Secondary | ICD-10-CM

## 2018-10-27 DIAGNOSIS — Z0001 Encounter for general adult medical examination with abnormal findings: Secondary | ICD-10-CM

## 2018-10-27 MED ORDER — PRAMIPEXOLE DIHYDROCHLORIDE 1 MG PO TABS: 1.0000 mg | ORAL_TABLET | Freq: Three times a day (TID) | ORAL | 3 refills | Status: DC

## 2018-10-27 MED ORDER — CARBIDOPA-LEVODOPA ER 50-200 MG PO TBCR: 1.0000 | EXTENDED_RELEASE_TABLET | Freq: Two times a day (BID) | ORAL | 3 refills | Status: DC

## 2018-10-27 NOTE — Patient Instructions (Signed)

## 2018-10-27 NOTE — Progress Notes (Signed)
Subjective:    Patient ID: Wesley Davenport, male    DOB: 21-Oct-1961, 57 y.o.   MRN: [id number]  No chief complaint on file.   HPI PT presents to the office for CPE and medication refill for restless leg syndrome. He is currently taking Sinemet BID and Mirapex 1 mg TID. He reports if he does not take his medications he has jerking of his legs and arms throughout the day.   He is a smoker and smokes about a pack a day.    Review of Systems  HENT: Positive for postnasal drip and rhinorrhea.   Eyes: Positive for redness and itching.  All other systems reviewed and are negative.      Objective:   Physical Exam Vitals signs reviewed.  Constitutional:      General: He is not in acute distress.    Appearance: He is well-developed.  HENT:     Head: Normocephalic.     Right Ear: External ear normal.     Left Ear: External ear normal.  Eyes:     General:        Right eye: No discharge.        Left eye: No discharge.     Pupils: Pupils are equal, round, and reactive to light.     Comments: Bilateral eye lids erythemas with mild swelling  Neck:     Musculoskeletal: Normal range of motion and neck supple.     Thyroid: No thyromegaly.  Cardiovascular:     Rate and Rhythm: Normal rate and regular rhythm.     Heart sounds: Normal heart sounds. No murmur.  Pulmonary:     Effort: Pulmonary effort is normal. No respiratory distress.     Breath sounds: Normal breath sounds. No wheezing.     Comments: Coarse nonproductive cough Abdominal:     General: Bowel sounds are normal. There is no distension.     Palpations: Abdomen is soft.     Tenderness: There is no abdominal tenderness.  Musculoskeletal: Normal range of motion.        General: No tenderness.  Skin:    General: Skin is warm and dry.     Findings: No erythema or rash.  Neurological:     Mental Status: He is alert and oriented to person, place, and time.     Cranial Nerves: No cranial nerve deficit.     Deep Tendon  Reflexes: Reflexes are normal and symmetric.  Psychiatric:        Behavior: Behavior normal.        Thought Content: Thought content normal.        Judgment: Judgment normal.       BP 123/77   Pulse 69   Temp 97.7 F (36.5 C) (Oral)   Ht 6' (1.829 m)   Wt 233 lb (105.7 kg)   BMI 31.60 kg/m      Assessment & Plan:  Wesley Davenport comes in today with chief complaint of No chief complaint on file.   Diagnosis and orders addressed:  1. Annual physical exam - CMP14+EGFR - CBC with Differential/Platelet - Lipid panel - TSH  2. Restless leg - CMP14+EGFR - CBC with Differential/Platelet  3. Smoker - CMP14+EGFR - CBC with Differential/Platelet  4. Restless leg syndrome, controlled - carbidopa-levodopa (SINEMET CR) 50-200 MG tablet; Take 1 tablet by mouth 2 (two) times daily.  Dispense: 180 tablet; Refill: 3 - pramipexole (MIRAPEX) 1 MG tablet; Take 1 tablet (1 mg total) by mouth 3 (three)  times daily.  Dispense: 270 tablet; Refill: 3   Labs pending Health Maintenance reviewed Diet and exercise encouraged  Follow up plan: 1 year    Evelina Dun, FNP

## 2018-10-28 ENCOUNTER — Other Ambulatory Visit: Payer: Self-pay | Admitting: Family

## 2018-10-28 ENCOUNTER — Telehealth: Payer: Self-pay | Admitting: Family

## 2018-10-28 DIAGNOSIS — E785 Hyperlipidemia, unspecified: Secondary | ICD-10-CM | POA: Insufficient documentation

## 2018-10-28 LAB — CBC WITH DIFFERENTIAL/PLATELET
BASOS: 1 %
Basophils Absolute: 0.1 10*3/uL (ref 0.0–0.2)
EOS (ABSOLUTE): 0.2 10*3/uL (ref 0.0–0.4)
Eos: 2 %
Hematocrit: 47.6 % (ref 37.5–51.0)
Hemoglobin: 16.1 g/dL (ref 13.0–17.7)
IMMATURE GRANS (ABS): 0.1 10*3/uL (ref 0.0–0.1)
Immature Granulocytes: 1 %
LYMPHS ABS: 2.9 10*3/uL (ref 0.7–3.1)
Lymphs: 27 %
MCH: 30.9 pg (ref 26.6–33.0)
MCHC: 33.8 g/dL (ref 31.5–35.7)
MCV: 91 fL (ref 79–97)
MONOS ABS: 0.9 10*3/uL (ref 0.1–0.9)
Monocytes: 8 %
NEUTROS ABS: 6.6 10*3/uL (ref 1.4–7.0)
Neutrophils: 61 %
Platelets: 265 10*3/uL (ref 150–450)
RBC: 5.21 x10E6/uL (ref 4.14–5.80)
RDW: 13.1 % (ref 12.3–15.4)
WBC: 10.6 10*3/uL (ref 3.4–10.8)

## 2018-10-28 LAB — CMP14+EGFR
A/G RATIO: 1.4 (ref 1.2–2.2)
ALT: 18 IU/L (ref 0–44)
AST: 20 IU/L (ref 0–40)
Albumin: 3.9 g/dL (ref 3.5–5.5)
Alkaline Phosphatase: 72 IU/L (ref 39–117)
BILIRUBIN TOTAL: 0.4 mg/dL (ref 0.0–1.2)
BUN / CREAT RATIO: 12 (ref 9–20)
BUN: 11 mg/dL (ref 6–24)
CALCIUM: 9 mg/dL (ref 8.7–10.2)
CHLORIDE: 103 mmol/L (ref 96–106)
CO2: 25 mmol/L (ref 20–29)
Creatinine, Ser: 0.94 mg/dL (ref 0.76–1.27)
GFR calc non Af Amer: 88 mL/min/{1.73_m2} (ref >59)
GFR, EST AFRICAN AMERICAN: 102 mL/min/{1.73_m2} (ref >59)
GLOBULIN, TOTAL: 2.8 g/dL (ref 1.5–4.5)
Glucose: 78 mg/dL (ref 65–99)
POTASSIUM: 4.2 mmol/L (ref 3.5–5.2)
SODIUM: 142 mmol/L (ref 134–144)
Total Protein: 6.7 g/dL (ref 6.0–8.5)

## 2018-10-28 LAB — LIPID PANEL
Chol/HDL Ratio: 4 ratio (ref 0.0–5.0)
Cholesterol, Total: 203 mg/dL — ABNORMAL HIGH (ref 100–199)
HDL: 51 mg/dL (ref >39)
LDL Calculated: 138 mg/dL — ABNORMAL HIGH (ref 0–99)
Triglycerides: 71 mg/dL (ref 0–149)
VLDL CHOLESTEROL CAL: 14 mg/dL (ref 5–40)

## 2018-10-28 LAB — TSH: TSH: 1.25 u[IU]/mL (ref 0.450–4.500)

## 2018-10-28 MED ORDER — ATORVASTATIN CALCIUM 20 MG PO TABS: 20.0000 mg | ORAL_TABLET | Freq: Every day | ORAL | 3 refills | Status: DC

## 2018-10-28 NOTE — Telephone Encounter (Signed)
Pt aware of results 

## 2019-11-02 ENCOUNTER — Other Ambulatory Visit: Payer: Self-pay | Admitting: Family

## 2019-11-02 DIAGNOSIS — G2581 Restless legs syndrome: Secondary | ICD-10-CM

## 2019-11-23 ENCOUNTER — Other Ambulatory Visit: Payer: Self-pay | Admitting: Family

## 2019-11-23 DIAGNOSIS — G2581 Restless legs syndrome: Secondary | ICD-10-CM

## 2019-11-24 NOTE — Telephone Encounter (Signed)
Stacks. NTBS LOV 10/27/18

## 2019-11-24 NOTE — Telephone Encounter (Signed)
Appt made and patient aware.  

## 2019-11-25 ENCOUNTER — Other Ambulatory Visit: Payer: Self-pay

## 2019-11-25 ENCOUNTER — Encounter: Payer: Self-pay | Admitting: Family Medicine

## 2019-11-25 ENCOUNTER — Ambulatory Visit (INDEPENDENT_AMBULATORY_CARE_PROVIDER_SITE_OTHER): Payer: 59 | Admitting: Family Medicine

## 2019-11-25 VITALS — BP 130/83 | HR 70 | Temp 98.2°F | Ht 72.0 in | Wt 238.6 lb

## 2019-11-25 DIAGNOSIS — N529 Male erectile dysfunction, unspecified: Secondary | ICD-10-CM | POA: Diagnosis not present

## 2019-11-25 DIAGNOSIS — Z125 Encounter for screening for malignant neoplasm of prostate: Secondary | ICD-10-CM

## 2019-11-25 DIAGNOSIS — F17200 Nicotine dependence, unspecified, uncomplicated: Secondary | ICD-10-CM

## 2019-11-25 DIAGNOSIS — G2581 Restless legs syndrome: Secondary | ICD-10-CM | POA: Diagnosis not present

## 2019-11-25 DIAGNOSIS — K4090 Unilateral inguinal hernia, without obstruction or gangrene, not specified as recurrent: Secondary | ICD-10-CM

## 2019-11-25 DIAGNOSIS — Z0000 Encounter for general adult medical examination without abnormal findings: Secondary | ICD-10-CM

## 2019-11-25 DIAGNOSIS — Z1211 Encounter for screening for malignant neoplasm of colon: Secondary | ICD-10-CM

## 2019-11-25 DIAGNOSIS — E782 Mixed hyperlipidemia: Secondary | ICD-10-CM | POA: Diagnosis not present

## 2019-11-25 DIAGNOSIS — Z1321 Encounter for screening for nutritional disorder: Secondary | ICD-10-CM

## 2019-11-25 MED ORDER — CARBIDOPA-LEVODOPA ER 50-200 MG PO TBCR: 1.0000 | EXTENDED_RELEASE_TABLET | Freq: Two times a day (BID) | ORAL | 3 refills | Status: DC

## 2019-11-25 MED ORDER — SILDENAFIL CITRATE 20 MG PO TABS: ORAL_TABLET | ORAL | 5 refills | Status: DC

## 2019-11-25 MED ORDER — PRAMIPEXOLE DIHYDROCHLORIDE 1 MG PO TABS: 1.0000 mg | ORAL_TABLET | Freq: Three times a day (TID) | ORAL | 3 refills | Status: DC

## 2019-11-25 MED ORDER — ATORVASTATIN CALCIUM 20 MG PO TABS: 20.0000 mg | ORAL_TABLET | Freq: Every day | ORAL | 3 refills | Status: DC

## 2019-11-26 ENCOUNTER — Other Ambulatory Visit: Payer: 59

## 2019-11-26 LAB — URINALYSIS
Bilirubin, UA: NEGATIVE
Glucose, UA: NEGATIVE
Ketones, UA: NEGATIVE
Leukocytes,UA: NEGATIVE
Nitrite, UA: NEGATIVE
Protein,UA: NEGATIVE
Specific Gravity, UA: 1.015 (ref 1.005–1.030)
Urobilinogen, Ur: 0.2 mg/dL (ref 0.2–1.0)
pH, UA: 5.5 (ref 5.0–7.5)

## 2019-11-27 ENCOUNTER — Other Ambulatory Visit: Payer: Self-pay | Admitting: Family Medicine

## 2019-11-27 MED ORDER — VITAMIN D (ERGOCALCIFEROL) 1.25 MG (50000 UNIT) PO CAPS: 50000.0000 [IU] | ORAL_CAPSULE | ORAL | 3 refills | Status: AC

## 2019-11-28 ENCOUNTER — Encounter: Payer: Self-pay | Admitting: Family Medicine

## 2019-11-28 LAB — CMP14+EGFR
ALT: 19 IU/L (ref 0–44)
AST: 20 IU/L (ref 0–40)
Albumin/Globulin Ratio: 1.6 (ref 1.2–2.2)
Albumin: 4.1 g/dL (ref 3.8–4.9)
Alkaline Phosphatase: 83 IU/L (ref 39–117)
BUN/Creatinine Ratio: 10 (ref 10–24)
BUN: 10 mg/dL (ref 8–27)
Bilirubin Total: 0.5 mg/dL (ref 0.0–1.2)
CO2: 22 mmol/L (ref 20–29)
Calcium: 9.4 mg/dL (ref 8.6–10.2)
Chloride: 101 mmol/L (ref 96–106)
Creatinine, Ser: 0.99 mg/dL (ref 0.76–1.27)
GFR calc Af Amer: 95 mL/min/{1.73_m2} (ref >59)
GFR calc non Af Amer: 82 mL/min/{1.73_m2} (ref >59)
Globulin, Total: 2.5 g/dL (ref 1.5–4.5)
Glucose: 103 mg/dL — ABNORMAL HIGH (ref 65–99)
Potassium: 4.4 mmol/L (ref 3.5–5.2)
Sodium: 137 mmol/L (ref 134–144)
Total Protein: 6.6 g/dL (ref 6.0–8.5)

## 2019-11-28 LAB — PSA TOTAL (REFLEX TO FREE): Prostate Specific Ag, Serum: 0.5 ng/mL (ref 0.0–4.0)

## 2019-11-28 LAB — TESTOSTERONE,FREE AND TOTAL
Testosterone, Free: 4.4 pg/mL — ABNORMAL LOW (ref 6.6–18.1)
Testosterone: 334 ng/dL (ref 264–916)

## 2019-11-28 LAB — LIPID PANEL
Chol/HDL Ratio: 3.9 ratio (ref 0.0–5.0)
Cholesterol, Total: 186 mg/dL (ref 100–199)
HDL: 48 mg/dL (ref >39)
LDL Chol Calc (NIH): 125 mg/dL — ABNORMAL HIGH (ref 0–99)
Triglycerides: 70 mg/dL (ref 0–149)
VLDL Cholesterol Cal: 13 mg/dL (ref 5–40)

## 2019-11-28 LAB — CBC WITH DIFFERENTIAL/PLATELET
Basophils Absolute: 0.1 10*3/uL (ref 0.0–0.2)
Basos: 0 %
EOS (ABSOLUTE): 0.2 10*3/uL (ref 0.0–0.4)
Eos: 2 %
Hematocrit: 48.5 % (ref 37.5–51.0)
Hemoglobin: 16.7 g/dL (ref 13.0–17.7)
Immature Grans (Abs): 0.1 10*3/uL (ref 0.0–0.1)
Immature Granulocytes: 1 %
Lymphocytes Absolute: 3.1 10*3/uL (ref 0.7–3.1)
Lymphs: 26 %
MCH: 30.8 pg (ref 26.6–33.0)
MCHC: 34.4 g/dL (ref 31.5–35.7)
MCV: 89 fL (ref 79–97)
Monocytes Absolute: 1.2 10*3/uL — ABNORMAL HIGH (ref 0.1–0.9)
Monocytes: 10 %
Neutrophils Absolute: 7.4 10*3/uL — ABNORMAL HIGH (ref 1.4–7.0)
Neutrophils: 61 %
Platelets: 244 10*3/uL (ref 150–450)
RBC: 5.43 x10E6/uL (ref 4.14–5.80)
RDW: 13.5 % (ref 11.6–15.4)
WBC: 11.9 10*3/uL — ABNORMAL HIGH (ref 3.4–10.8)

## 2019-11-28 LAB — VITAMIN D 25 HYDROXY (VIT D DEFICIENCY, FRACTURES): Vit D, 25-Hydroxy: 15.5 ng/mL — ABNORMAL LOW (ref 30.0–100.0)

## 2019-11-28 NOTE — Progress Notes (Signed)
Subjective:  Patient ID: Wesley Davenport, male    DOB: 24-May-1961  Age: 59 y.o. MRN: [id number]  CC: Annual Exam .HPI Wesley Davenport presents for Annual exam. POt. Reports bilateral 3-4/10 knee pain with stiffness late in the day, but not in mornings. He gets relief with BC powderrs multiple times a day. He also medicates for restless legs.using mirapex and sinemet. Relief is partial. He uses sildenafil for erectile concerns.    Depression screen [facility] 2/9 11/25/2019 10/27/2018 08/13/2017  Decreased Interest 0 0 0  Down, Depressed, Hopeless 0 1 0  PHQ - 2 Score 0 1 0    History Wesley Davenport has no past medical history on file.   He has a past surgical history that includes Hemorrhoid surgery.   His family history includes Cancer in his mother; Heart disease in his brother and father.He reports that he has been smoking. He has never used smokeless tobacco. He reports current alcohol use. He reports that he does not use drugs.    ROS Review of Systems  Constitutional: Negative for activity change, chills, diaphoresis, fatigue, fever and unexpected weight change.  HENT: Negative for congestion, ear pain, hearing loss, nosebleeds, postnasal drip, sore throat, tinnitus and trouble swallowing.   Eyes: Negative for photophobia, pain, discharge, redness and visual disturbance.  Respiratory: Negative for cough, chest tightness, shortness of breath and wheezing.   Cardiovascular: Negative for chest pain, palpitations and leg swelling.  Gastrointestinal: Negative for abdominal distention, abdominal pain, blood in stool, constipation, diarrhea, nausea and vomiting.  Endocrine: Negative for cold intolerance, heat intolerance and polydipsia.  Genitourinary: Negative for difficulty urinating, dysuria, flank pain, frequency, hematuria and urgency.  Musculoskeletal: Negative for arthralgias, back pain, joint swelling, myalgias and neck pain.  Skin: Negative for color change, rash and wound.   Allergic/Immunologic: Negative for environmental allergies.  Neurological: Negative for dizziness, tremors, seizures, syncope, speech difficulty, weakness, light-headedness, numbness and headaches.  Hematological: Does not bruise/bleed easily.  Psychiatric/Behavioral: Negative for confusion, decreased concentration, dysphoric mood, hallucinations, sleep disturbance and suicidal ideas. The patient is not nervous/anxious.     Objective:  BP 130/83   Pulse 70   Temp 98.2 F (36.8 C) (Temporal)   Ht 6' (1.829 m)   Wt 238 lb 9.6 oz (108.2 kg)   BMI 32.36 kg/m   BP Readings from Last 3 Encounters:  11/25/19 130/83  10/27/18 123/77  08/13/17 117/67    Wt Readings from Last 3 Encounters:  11/25/19 238 lb 9.6 oz (108.2 kg)  10/27/18 233 lb (105.7 kg)  08/13/17 227 lb (103 kg)     Physical Exam Constitutional:      Appearance: He is well-developed.  HENT:     Head: Normocephalic and atraumatic.  Eyes:     Pupils: Pupils are equal, round, and reactive to light.  Neck:     Thyroid: No thyromegaly.     Trachea: No tracheal deviation.  Cardiovascular:     Rate and Rhythm: Normal rate and regular rhythm.     Heart sounds: Normal heart sounds. No murmur. No friction rub. No gallop.   Pulmonary:     Breath sounds: Normal breath sounds. No wheezing or rales.  Abdominal:     General: Bowel sounds are normal. There is no distension.     Palpations: Abdomen is soft. There is no mass.     Tenderness: There is no abdominal tenderness.     Hernia: A hernia is present. Hernia is present in the left inguinal area (Easily palpable  at the left inguinal ring.  Could not be readily reduced.  It is disguised by a large fat pad). There is no hernia in the right inguinal area.  Genitourinary:    Penis: Normal.      Testes: Normal.        Right: Mass or tenderness not present.        Left: Mass or tenderness not present.     Epididymis:     Right: Normal.     Left: Normal.  Musculoskeletal:         General: Normal range of motion.     Cervical back: Normal range of motion.  Lymphadenopathy:     Cervical: No cervical adenopathy.  Skin:    General: Skin is warm and dry.  Neurological:     Mental Status: He is alert and oriented to person, place, and time.    Results for orders placed or performed in visit on 11/25/19  CBC  Result Value Ref Range   WBC 11.9 (H) 3.4 - 10.8 x10E3/uL   RBC 5.43 4.14 - 5.80 x10E6/uL   Hemoglobin 16.7 13.0 - 17.7 g/dL   Hematocrit 48.5 37.5 - 51.0 %   MCV 89 79 - 97 fL   MCH 30.8 26.6 - 33.0 pg   MCHC 34.4 31.5 - 35.7 g/dL   RDW 13.5 11.6 - 15.4 %   Platelets 244 150 - 450 x10E3/uL   Neutrophils 61 Not Estab. %   Lymphs 26 Not Estab. %   Monocytes 10 Not Estab. %   Eos 2 Not Estab. %   Basos 0 Not Estab. %   Neutrophils Absolute 7.4 (H) 1.4 - 7.0 x10E3/uL   Lymphocytes Absolute 3.1 0.7 - 3.1 x10E3/uL   Monocytes Absolute 1.2 (H) 0.1 - 0.9 x10E3/uL   EOS (ABSOLUTE) 0.2 0.0 - 0.4 x10E3/uL   Basophils Absolute 0.1 0.0 - 0.2 x10E3/uL   Immature Granulocytes 1 Not Estab. %   Immature Grans (Abs) 0.1 0.0 - 0.1 x10E3/uL  CMP  Result Value Ref Range   Glucose 103 (H) 65 - 99 mg/dL   BUN 10 8 - 27 mg/dL   Creatinine, Ser 0.99 0.76 - 1.27 mg/dL   GFR calc non Af Amer 82 >59 mL/min/1.73   GFR calc Af Amer 95 >59 mL/min/1.73   BUN/Creatinine Ratio 10 10 - 24   Sodium 137 134 - 144 mmol/L   Potassium 4.4 3.5 - 5.2 mmol/L   Chloride 101 96 - 106 mmol/L   CO2 22 20 - 29 mmol/L   Calcium 9.4 8.6 - 10.2 mg/dL   Total Protein 6.6 6.0 - 8.5 g/dL   Albumin 4.1 3.8 - 4.9 g/dL   Globulin, Total 2.5 1.5 - 4.5 g/dL   Albumin/Globulin Ratio 1.6 1.2 - 2.2   Bilirubin Total 0.5 0.0 - 1.2 mg/dL   Alkaline Phosphatase 83 39 - 117 IU/L   AST 20 0 - 40 IU/L   ALT 19 0 - 44 IU/L  Lipid  Result Value Ref Range   Cholesterol, Total 186 100 - 199 mg/dL   Triglycerides 70 0 - 149 mg/dL   HDL 48 >39 mg/dL   VLDL Cholesterol Cal 13 5 - 40 mg/dL   LDL Chol  Calc (NIH) 125 (H) 0 - 99 mg/dL   Chol/HDL Ratio 3.9 0.0 - 5.0 ratio  PSA Total (Reflex To Free)  Result Value Ref Range   Prostate Specific Ag, Serum 0.5 0.0 - 4.0 ng/mL   Reflex Criteria Comment  VITAMIN D  Result Value Ref Range   Vit D, 25-Hydroxy 15.5 (L) 30.0 - 100.0 ng/mL  Urinalysis- dip only  Result Value Ref Range   Specific Gravity, UA 1.015 1.005 - 1.030   pH, UA 5.5 5.0 - 7.5   Color, UA Yellow Yellow   Appearance Ur Clear Clear   Leukocytes,UA Negative Negative   Protein,UA Negative Negative/Trace   Glucose, UA Negative Negative   Ketones, UA Negative Negative   RBC, UA 1+ (A) Negative   Bilirubin, UA Negative Negative   Urobilinogen, Ur 0.2 0.2 - 1.0 mg/dL   Nitrite, UA Negative Negative  Testosterone  Result Value Ref Range   Testosterone 334 264 - 916 ng/dL   Testosterone, Free WILL FOLLOW       Assessment & Plan:   Wesley Davenport was seen today for annual exam.  Diagnoses and all orders for this visit:  Mixed hyperlipidemia -     atorvastatin (LIPITOR) 20 MG tablet; Take 1 tablet (20 mg total) by mouth daily. -     CBC -     CMP -     Lipid  Restless leg syndrome, controlled -     pramipexole (MIRAPEX) 1 MG tablet; Take 1 tablet (1 mg total) by mouth 3 (three) times daily. -     carbidopa-levodopa (SINEMET CR) 50-200 MG tablet; Take 1 tablet by mouth 2 (two) times daily. -     CBC -     CMP  Smoker -     CBC -     CMP  Vasculogenic erectile dysfunction, unspecified vasculogenic erectile dysfunction type -     sildenafil (REVATIO) 20 MG tablet; Take 2-5 pills at once, orally, with each sexual encounter -     CBC -     CMP -     Testosterone  Well adult exam -     CBC -     CMP -     Colonoscopy -     VITAMIN D -     Urinalysis- dip only  Screening for prostate cancer -     CBC -     CMP -     PSA Total (Reflex To Free)  Screen for colon cancer -     CBC -     CMP -     Colonoscopy  Encounter for vitamin deficiency screening -      CBC -     CMP -     VITAMIN D  Left inguinal hernia       I am having Wesley Davenport start on sildenafil. I am also having him maintain his pramipexole, carbidopa-levodopa, and atorvastatin.  Allergies as of 11/25/2019   No Known Allergies     Medication List       Accurate as of November 25, 2019 11:59 PM. If you have any questions, ask your nurse or doctor.        atorvastatin 20 MG tablet Commonly known as: LIPITOR Take 1 tablet (20 mg total) by mouth daily.   carbidopa-levodopa 50-200 MG tablet Commonly known as: SINEMET CR Take 1 tablet by mouth 2 (two) times daily.   pramipexole 1 MG tablet Commonly known as: MIRAPEX Take 1 tablet (1 mg total) by mouth 3 (three) times daily.   sildenafil 20 MG tablet Commonly known as: REVATIO Take 2-5 pills at once, orally, with each sexual encounter Started by: Mechele Claude, MD      Patient appears to be in excellent condition based on  his physical today.  Although he deals with restless leg syndrome and some knee pain those appear to be quite stable at this time.  Erectile dysfunction is equally well treated with sildenafil.  His atorvastatin for his cholesterol does not appear to be causing any significant side effects for him.  Of note is that subsequent to this evaluation lab report came back showing vitamin D deficiency.  Prescription for that has been sent in as well.  Patient is reluctant to undergo colonoscopy as a result of his schedule and concerned about coronavirus exposure.  Both of these were discussed.  Of note is that he is now 10+ years behind on this important screening.  Prostate screening was performed today.  On examination there was a significant left inguinal hernia.  He will be referred to surgery for that.  He understands that the surgery is elective.  However complications can occur if he waits too long.  Follow-up: Return in about 6 months (around 05/24/2020).  Mechele Claude, M.D.

## 2019-11-29 ENCOUNTER — Telehealth: Payer: Self-pay | Admitting: Family Medicine

## 2019-11-30 ENCOUNTER — Telehealth: Payer: Self-pay | Admitting: *Deleted

## 2019-11-30 NOTE — Telephone Encounter (Signed)
It will need to be paid by cash. See if we need to send the scrip to another pharmacy. [facility] has a good price for this.

## 2019-11-30 NOTE — Telephone Encounter (Signed)
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

## 2019-12-03 ENCOUNTER — Encounter: Payer: Self-pay | Admitting: *Deleted

## 2019-12-06 NOTE — Telephone Encounter (Signed)
Multiple attempts made to contact patient.  This encounter will now be closed  

## 2020-01-26 ENCOUNTER — Encounter: Payer: Self-pay | Admitting: Family Medicine

## 2020-05-24 ENCOUNTER — Ambulatory Visit: Payer: 59 | Admitting: Family Medicine

## 2021-04-30 ENCOUNTER — Other Ambulatory Visit: Payer: Self-pay | Admitting: Family Medicine

## 2021-04-30 DIAGNOSIS — G2581 Restless legs syndrome: Secondary | ICD-10-CM

## 2021-05-07 ENCOUNTER — Other Ambulatory Visit: Payer: Self-pay | Admitting: Family Medicine

## 2021-05-07 DIAGNOSIS — G2581 Restless legs syndrome: Secondary | ICD-10-CM

## 2021-05-07 MED ORDER — PRAMIPEXOLE DIHYDROCHLORIDE 1 MG PO TABS: 1.0000 mg | ORAL_TABLET | Freq: Three times a day (TID) | ORAL | 1 refills | Status: DC

## 2021-05-07 NOTE — Telephone Encounter (Signed)
  Prescription Request  05/07/2021  What is the name of the medication or equipment? pramipexole  Have you contacted your pharmacy to request a refill? (if applicable) yes  Which pharmacy would you like this sent to? [facility] in [place]    Patient notified that their request is being sent to the clinical staff for review and that they should receive a response within 2 business days.

## 2021-05-08 NOTE — Telephone Encounter (Signed)
Aware refill sent to pharmacy & reminder of 06/12/21 2:55 appt

## 2021-06-12 ENCOUNTER — Encounter: Payer: Self-pay | Admitting: Family Medicine

## 2021-06-12 ENCOUNTER — Ambulatory Visit: Payer: 59 | Admitting: Family Medicine

## 2021-07-19 ENCOUNTER — Other Ambulatory Visit: Payer: Self-pay

## 2021-07-19 ENCOUNTER — Ambulatory Visit (INDEPENDENT_AMBULATORY_CARE_PROVIDER_SITE_OTHER): Payer: 59 | Admitting: Family Medicine

## 2021-07-19 ENCOUNTER — Encounter: Payer: Self-pay | Admitting: Family Medicine

## 2021-07-19 VITALS — BP 158/101 | HR 63 | Temp 98.2°F | Ht 72.0 in | Wt 231.0 lb

## 2021-07-19 DIAGNOSIS — N529 Male erectile dysfunction, unspecified: Secondary | ICD-10-CM

## 2021-07-19 DIAGNOSIS — G2581 Restless legs syndrome: Secondary | ICD-10-CM | POA: Diagnosis not present

## 2021-07-19 DIAGNOSIS — E782 Mixed hyperlipidemia: Secondary | ICD-10-CM | POA: Diagnosis not present

## 2021-07-19 MED ORDER — CARBIDOPA-LEVODOPA ER 50-200 MG PO TBCR: 1.0000 | EXTENDED_RELEASE_TABLET | Freq: Two times a day (BID) | ORAL | 3 refills | Status: DC

## 2021-07-19 MED ORDER — SILDENAFIL CITRATE 20 MG PO TABS: ORAL_TABLET | ORAL | 5 refills | Status: DC

## 2021-07-19 MED ORDER — ATORVASTATIN CALCIUM 20 MG PO TABS: 20.0000 mg | ORAL_TABLET | Freq: Every day | ORAL | 3 refills | Status: DC

## 2021-07-19 MED ORDER — CELECOXIB 200 MG PO CAPS: 200.0000 mg | ORAL_CAPSULE | Freq: Every day | ORAL | 5 refills | Status: DC

## 2021-07-19 NOTE — Patient Instructions (Signed)

## 2021-07-19 NOTE — Progress Notes (Signed)
Subjective:  Patient ID: Wesley Davenport, male    DOB: 11/11/1961  Age: 60 y.o. MRN: [id number]  CC: Medical Management of Chronic Issues   HPI Wesley Davenport presents for follow-up of elevated cholesterol. Doing well without complaints on current medication. Denies side effects of statin including myalgia and arthralgia and nausea. Also in today for liver function testing. Currently no chest pain, shortness of breath or other cardiovascular related symptoms noted.  E.D. occurring . Needs reassurrance that 5 pills (100 mg) is okay.  Restless legs good with current regimen History Wesley Davenport has no past medical history on file.   Wesley Davenport has a past surgical history that includes Hemorrhoid surgery.   His family history includes Cancer in his mother; Heart disease in his brother and father.Wesley Davenport reports that Wesley Davenport has been smoking. Wesley Davenport has never used smokeless tobacco. Wesley Davenport reports current alcohol use. Wesley Davenport reports that Wesley Davenport does not use drugs.  Current Outpatient Medications on File Prior to Visit  Medication Sig Dispense Refill   pramipexole (MIRAPEX) 1 MG tablet Take 1 tablet (1 mg total) by mouth 3 (three) times daily. 270 tablet 1   No current facility-administered medications on file prior to visit.    ROS Review of Systems  Constitutional:  Negative for fever.  Respiratory:  Negative for shortness of breath.   Cardiovascular:  Negative for chest pain.  Musculoskeletal:  Negative for arthralgias.  Skin:  Negative for rash.   Objective:  BP (!) 158/101   Pulse 63   Temp 98.2 F (36.8 C) (Temporal)   Ht 6' (1.829 m)   Wt 231 lb (104.8 kg)   SpO2 93%   BMI 31.33 kg/m   BP Readings from Last 3 Encounters:  07/19/21 (!) 158/101  11/25/19 130/83  10/27/18 123/77    Wt Readings from Last 3 Encounters:  07/19/21 231 lb (104.8 kg)  11/25/19 238 lb 9.6 oz (108.2 kg)  10/27/18 233 lb (105.7 kg)     Physical Exam Vitals reviewed.  Constitutional:      Appearance: Wesley Davenport is well-developed.   HENT:     Head: Normocephalic and atraumatic.     Right Ear: External ear normal.     Left Ear: External ear normal.     Mouth/Throat:     Pharynx: No oropharyngeal exudate or posterior oropharyngeal erythema.  Eyes:     Pupils: Pupils are equal, round, and reactive to light.  Cardiovascular:     Rate and Rhythm: Normal rate and regular rhythm.     Heart sounds: No murmur heard. Pulmonary:     Effort: No respiratory distress.     Breath sounds: Normal breath sounds.  Musculoskeletal:     Cervical back: Normal range of motion and neck supple.  Neurological:     Mental Status: Wesley Davenport is alert and oriented to person, place, and time.    No results found for: HGBA1C  Lab Results  Component Value Date   WBC 11.2 (H) 07/19/2021   HGB 16.4 07/19/2021   HCT 47.0 07/19/2021   PLT 234 07/19/2021   GLUCOSE 90 07/19/2021   CHOL 206 (H) 07/19/2021   TRIG 63 07/19/2021   HDL 57 07/19/2021   LDLCALC 138 (H) 07/19/2021   ALT 27 07/19/2021   AST 22 07/19/2021   NA 141 07/19/2021   K 4.1 07/19/2021   CL 102 07/19/2021   CREATININE 0.93 07/19/2021   BUN 12 07/19/2021   CO2 26 07/19/2021   TSH 1.250 10/27/2018    No  results found.  Assessment & Plan:   Wesley Davenport was seen today for medical management of chronic issues.  Diagnoses and all orders for this visit:  Mixed hyperlipidemia -     CBC with Differential/Platelet -     CMP14+EGFR -     Lipid panel -     atorvastatin (LIPITOR) 20 MG tablet; Take 1 tablet (20 mg total) by mouth daily.  Vasculogenic erectile dysfunction, unspecified vasculogenic erectile dysfunction type -     sildenafil (REVATIO) 20 MG tablet; Take 2-5 pills at once, orally, with each sexual encounter  Restless leg syndrome, controlled -     carbidopa-levodopa (SINEMET CR) 50-200 MG tablet; Take 1 tablet by mouth 2 (two) times daily.  Other orders -     celecoxib (CELEBREX) 200 MG capsule; Take 1 capsule (200 mg total) by mouth daily. With food  I am having  Wesley Davenport start on celecoxib. I am also having Wesley Davenport maintain his pramipexole, sildenafil, carbidopa-levodopa, and atorvastatin.  Meds ordered this encounter  Medications   sildenafil (REVATIO) 20 MG tablet    Sig: Take 2-5 pills at once, orally, with each sexual encounter    Dispense:  50 tablet    Refill:  5   celecoxib (CELEBREX) 200 MG capsule    Sig: Take 1 capsule (200 mg total) by mouth daily. With food    Dispense:  30 capsule    Refill:  5   carbidopa-levodopa (SINEMET CR) 50-200 MG tablet    Sig: Take 1 tablet by mouth 2 (two) times daily.    Dispense:  180 tablet    Refill:  3    Please consider 90 day supplies to promote better adherence   atorvastatin (LIPITOR) 20 MG tablet    Sig: Take 1 tablet (20 mg total) by mouth daily.    Dispense:  90 tablet    Refill:  3     Follow-up: Return in about 6 months (around 01/16/2022) for [facility] physical.  Claretta Fraise, M.D.

## 2021-07-20 LAB — LIPID PANEL
Chol/HDL Ratio: 3.6 ratio (ref 0.0–5.0)
Cholesterol, Total: 206 mg/dL — ABNORMAL HIGH (ref 100–199)
HDL: 57 mg/dL (ref >39)
LDL Chol Calc (NIH): 138 mg/dL — ABNORMAL HIGH (ref 0–99)
Triglycerides: 63 mg/dL (ref 0–149)
VLDL Cholesterol Cal: 11 mg/dL (ref 5–40)

## 2021-07-20 LAB — CMP14+EGFR
ALT: 27 IU/L (ref 0–44)
AST: 22 IU/L (ref 0–40)
Albumin/Globulin Ratio: 2 (ref 1.2–2.2)
Albumin: 4.6 g/dL (ref 3.8–4.8)
Alkaline Phosphatase: 81 IU/L (ref 44–121)
BUN/Creatinine Ratio: 13 (ref 10–24)
BUN: 12 mg/dL (ref 8–27)
Bilirubin Total: 0.4 mg/dL (ref 0.0–1.2)
CO2: 26 mmol/L (ref 20–29)
Calcium: 9.4 mg/dL (ref 8.6–10.2)
Chloride: 102 mmol/L (ref 96–106)
Creatinine, Ser: 0.93 mg/dL (ref 0.76–1.27)
Globulin, Total: 2.3 g/dL (ref 1.5–4.5)
Glucose: 90 mg/dL (ref 65–99)
Potassium: 4.1 mmol/L (ref 3.5–5.2)
Sodium: 141 mmol/L (ref 134–144)
Total Protein: 6.9 g/dL (ref 6.0–8.5)
eGFR: 93 mL/min/{1.73_m2} (ref >59)

## 2021-07-20 LAB — CBC WITH DIFFERENTIAL/PLATELET
Basophils Absolute: 0.1 10*3/uL (ref 0.0–0.2)
Basos: 0 %
EOS (ABSOLUTE): 0.1 10*3/uL (ref 0.0–0.4)
Eos: 1 %
Hematocrit: 47 % (ref 37.5–51.0)
Hemoglobin: 16.4 g/dL (ref 13.0–17.7)
Immature Grans (Abs): 0 10*3/uL (ref 0.0–0.1)
Immature Granulocytes: 0 %
Lymphocytes Absolute: 2.8 10*3/uL (ref 0.7–3.1)
Lymphs: 25 %
MCH: 31.5 pg (ref 26.6–33.0)
MCHC: 34.9 g/dL (ref 31.5–35.7)
MCV: 90 fL (ref 79–97)
Monocytes Absolute: 0.9 10*3/uL (ref 0.1–0.9)
Monocytes: 8 %
Neutrophils Absolute: 7.2 10*3/uL — ABNORMAL HIGH (ref 1.4–7.0)
Neutrophils: 66 %
Platelets: 234 10*3/uL (ref 150–450)
RBC: 5.21 x10E6/uL (ref 4.14–5.80)
RDW: 13.1 % (ref 11.6–15.4)
WBC: 11.2 10*3/uL — ABNORMAL HIGH (ref 3.4–10.8)

## 2021-07-23 ENCOUNTER — Encounter: Payer: Self-pay | Admitting: Family Medicine

## 2021-10-31 ENCOUNTER — Encounter: Payer: Self-pay | Admitting: *Deleted

## 2022-01-16 ENCOUNTER — Encounter: Payer: 59 | Admitting: Family Medicine

## 2022-01-17 ENCOUNTER — Encounter: Payer: Self-pay | Admitting: Family Medicine

## 2022-01-17 ENCOUNTER — Ambulatory Visit (INDEPENDENT_AMBULATORY_CARE_PROVIDER_SITE_OTHER): Payer: 59 | Admitting: Family Medicine

## 2022-01-17 ENCOUNTER — Ambulatory Visit (INDEPENDENT_AMBULATORY_CARE_PROVIDER_SITE_OTHER): Payer: 59

## 2022-01-17 VITALS — BP 158/84 | HR 71 | Temp 97.8°F | Ht 72.0 in | Wt 233.8 lb

## 2022-01-17 DIAGNOSIS — F17200 Nicotine dependence, unspecified, uncomplicated: Secondary | ICD-10-CM

## 2022-01-17 DIAGNOSIS — M25562 Pain in left knee: Secondary | ICD-10-CM | POA: Diagnosis not present

## 2022-01-17 DIAGNOSIS — E559 Vitamin D deficiency, unspecified: Secondary | ICD-10-CM | POA: Diagnosis not present

## 2022-01-17 DIAGNOSIS — M25561 Pain in right knee: Secondary | ICD-10-CM | POA: Diagnosis not present

## 2022-01-17 DIAGNOSIS — Z0001 Encounter for general adult medical examination with abnormal findings: Secondary | ICD-10-CM | POA: Diagnosis not present

## 2022-01-17 DIAGNOSIS — E782 Mixed hyperlipidemia: Secondary | ICD-10-CM | POA: Diagnosis not present

## 2022-01-17 DIAGNOSIS — Z125 Encounter for screening for malignant neoplasm of prostate: Secondary | ICD-10-CM | POA: Diagnosis not present

## 2022-01-17 DIAGNOSIS — G8929 Other chronic pain: Secondary | ICD-10-CM

## 2022-01-17 DIAGNOSIS — Z0000 Encounter for general adult medical examination without abnormal findings: Secondary | ICD-10-CM

## 2022-01-17 DIAGNOSIS — Z122 Encounter for screening for malignant neoplasm of respiratory organs: Secondary | ICD-10-CM

## 2022-01-17 DIAGNOSIS — K4090 Unilateral inguinal hernia, without obstruction or gangrene, not specified as recurrent: Secondary | ICD-10-CM | POA: Insufficient documentation

## 2022-01-17 DIAGNOSIS — G2581 Restless legs syndrome: Secondary | ICD-10-CM

## 2022-01-17 MED ORDER — PRAMIPEXOLE DIHYDROCHLORIDE 1 MG PO TABS: 1.0000 mg | ORAL_TABLET | Freq: Three times a day (TID) | ORAL | 3 refills | Status: DC

## 2022-01-17 MED ORDER — CARBIDOPA-LEVODOPA ER 50-200 MG PO TBCR: 2.0000 | EXTENDED_RELEASE_TABLET | Freq: Two times a day (BID) | ORAL | 3 refills | Status: DC

## 2022-01-17 MED ORDER — VARENICLINE TARTRATE 1 MG PO TABS: 1.0000 mg | ORAL_TABLET | Freq: Two times a day (BID) | ORAL | 5 refills | Status: DC

## 2022-01-17 MED ORDER — DICLOFENAC SODIUM 75 MG PO TBEC: 75.0000 mg | DELAYED_RELEASE_TABLET | Freq: Two times a day (BID) | ORAL | 2 refills | Status: DC

## 2022-01-17 MED ORDER — CHANTIX STARTING MONTH PAK 0.5 MG X 11 & 1 MG X 42 PO TBPK: 1.0000 | ORAL_TABLET | Freq: Two times a day (BID) | ORAL | 0 refills | Status: DC

## 2022-01-17 NOTE — Progress Notes (Addendum)
? ?Subjective:  ?Patient ID: Wesley Davenport, male    DOB: 05/29/1961  Age: 62 y.o. MRN: [id number] ? ?CC: Annual Exam ? ? ?HPI ?Wesley Davenport presents for CPE. 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 Worrying due to some bad news. Restless legs is keeping him awake at night.Knee pain contributes. Still smoking >30 PY hx. Wants chantix to quit ? ? ?Depression screen [facility] 2/9 01/17/2022 07/19/2021 11/25/2019  ?Decreased Interest 0 0 0  ?Down, Depressed, Hopeless 0 0 0  ?PHQ - 2 Score 0 0 0  ? ? ?History ?Wesley Davenport has no past medical history on file.  ? ?Wesley Davenport has a past surgical history that includes Hemorrhoid surgery.  ? ?His family history includes Cancer in his mother; Heart disease in his brother and father.Wesley Davenport reports that Wesley Davenport has been smoking. Wesley Davenport has never used smokeless tobacco. Wesley Davenport reports current alcohol use. Wesley Davenport reports that Wesley Davenport does not use drugs. ? ? ? ?ROS ?Review of Systems  ?Constitutional:  Negative for activity change, fatigue and unexpected weight change.  ?HENT:  Negative for congestion, ear pain, hearing loss and postnasal drip.   ?Eyes:  Negative for pain and visual disturbance.  ?Respiratory:  Negative for cough, chest tightness and shortness of breath.   ?Cardiovascular:  Negative for chest pain, palpitations and leg swelling.  ?Gastrointestinal:  Positive for blood in stool. Negative for abdominal distention, abdominal pain, constipation, diarrhea, nausea and vomiting.  ?Endocrine: Negative for cold intolerance, heat intolerance and polydipsia.  ?Genitourinary:  Negative for difficulty urinating, dysuria, flank pain, frequency and urgency.  ?Musculoskeletal:  Positive for arthralgias (knees hurt so bad Wesley Davenport can hardly walk. 9/10). Negative for joint swelling.  ?Skin:  Negative for color change, rash and wound.  ?     Toe nails aren't right  ?Neurological:  Negative for dizziness, syncope, speech difficulty, weakness, light-headedness, numbness and headaches.  ?Hematological:  Does not  bruise/bleed easily.  ?Psychiatric/Behavioral:  Negative for confusion, decreased concentration, dysphoric mood and sleep disturbance. The patient is not nervous/anxious.   ? ?Objective:  ?BP (!) 158/84   Pulse 71   Temp 97.8 ?F (36.6 ?C)   Ht 6' (1.829 m)   Wt 233 lb 12.8 oz (106.1 kg)   SpO2 95%   BMI 31.71 kg/m?  ? ?BP Readings from Last 3 Encounters:  ?01/17/22 (!) 158/84  ?07/19/21 (!) 158/101  ?11/25/19 130/83  ? ? ?Wt Readings from Last 3 Encounters:  ?01/17/22 233 lb 12.8 oz (106.1 kg)  ?07/19/21 231 lb (104.8 kg)  ?11/25/19 238 lb 9.6 oz (108.2 kg)  ? ? ? ?Physical Exam ?Constitutional:   ?   Appearance: Wesley Davenport is well-developed.  ?HENT:  ?   Head: Normocephalic and atraumatic.  ?Eyes:  ?   Pupils: Pupils are equal, round, and reactive to light.  ?Neck:  ?   Thyroid: No thyromegaly.  ?   Trachea: No tracheal deviation.  ?Cardiovascular:  ?   Rate and Rhythm: Normal rate and regular rhythm.  ?   Heart sounds: Normal heart sounds. No murmur heard. ?  No friction rub. No gallop.  ?Pulmonary:  ?   Breath sounds: Normal breath sounds. No wheezing or rales.  ?Abdominal:  ?   General: Bowel sounds are normal. There is no distension.  ?   Palpations: Abdomen is soft. There is no mass.  ?   Tenderness: There is no abdominal tenderness.  ?   Hernia: There is no hernia in the left inguinal area.  ?  Genitourinary: ?   Penis: Normal.   ?   Testes: Normal.  ?Musculoskeletal:     ?   General: Normal range of motion.  ?   Cervical back: Normal range of motion.  ?Lymphadenopathy:  ?   Cervical: No cervical adenopathy.  ?Skin: ?   General: Skin is warm and dry.  ?Neurological:  ?   Mental Status: Wesley Davenport is alert and oriented to person, place, and time.  ? ? ? ? ?Assessment & Plan:  ? ?Wesley Davenport was seen today for annual exam. ? ?Diagnoses and all orders for this visit: ? ?Mixed hyperlipidemia ?-     Lipid panel ? ?Well adult exam ?-     CBC with Differential/Platelet ?-     CMP14+EGFR ?-     Lipid panel ?-     PSA, total and  free ?-     VITAMIN D 25 Hydroxy (Vit-D Deficiency, Fractures) ? ?Vitamin D deficiency ?-     VITAMIN D 25 Hydroxy (Vit-D Deficiency, Fractures) ? ?Prostate cancer screening ?-     PSA, total and free ? ?Restless leg syndrome, controlled ?-     pramipexole (MIRAPEX) 1 MG tablet; Take 1 tablet (1 mg total) by mouth 3 (three) times daily. ? ?Left inguinal hernia ? ?Chronic pain of both knees ?-     DG Knee 1-2 Views Left; Future ?-     DG Knee 1-2 Views Right; Future ? ?Other orders ?-     diclofenac (VOLTAREN) 75 MG EC tablet; Take 1 tablet (75 mg total) by mouth 2 (two) times daily. For muscle and  Joint pain ? ? ? ?I have discontinued Temple Boehne's celecoxib. I am also having him start on diclofenac. Additionally, I am having him maintain his sildenafil, carbidopa-levodopa, atorvastatin, and pramipexole. ? ?Allergies as of 01/17/2022   ?No Known Allergies ?  ? ?  ?Medication List  ?  ? ?  ? Accurate as of January 17, 2022  1:34 PM. If you have any questions, ask your nurse or doctor.  ?  ?  ? ?  ? ?STOP taking these medications   ? ?celecoxib 200 MG capsule ?Commonly known as: CeleBREX ?Stopped by: Claretta Fraise, MD ?  ? ?  ? ?TAKE these medications   ? ?atorvastatin 20 MG tablet ?Commonly known as: LIPITOR ?Take 1 tablet (20 mg total) by mouth daily. ?  ?carbidopa-levodopa 50-200 MG tablet ?Commonly known as: SINEMET CR ?Take 1 tablet by mouth 2 (two) times daily. ?  ?diclofenac 75 MG EC tablet ?Commonly known as: VOLTAREN ?Take 1 tablet (75 mg total) by mouth 2 (two) times daily. For muscle and  Joint pain ?Started by: Claretta Fraise, MD ?  ?pramipexole 1 MG tablet ?Commonly known as: MIRAPEX ?Take 1 tablet (1 mg total) by mouth 3 (three) times daily. ?  ?sildenafil 20 MG tablet ?Commonly known as: REVATIO ?Take 2-5 pills at once, orally, with each sexual encounter ?  ? ?  ? ? ? ?Follow-up: Return in about 4 months (around 05/19/2022). ? ?Claretta Fraise, M.D. ?

## 2022-01-17 NOTE — Addendum Note (Signed)
Addended by: Claretta Fraise on: 01/17/2022 01:47 PM ? ? Modules accepted: Orders ? ?

## 2022-01-22 ENCOUNTER — Other Ambulatory Visit: Payer: Self-pay | Admitting: Family Medicine

## 2022-01-22 DIAGNOSIS — G8929 Other chronic pain: Secondary | ICD-10-CM

## 2022-01-30 ENCOUNTER — Ambulatory Visit (INDEPENDENT_AMBULATORY_CARE_PROVIDER_SITE_OTHER): Payer: 59

## 2022-01-30 ENCOUNTER — Ambulatory Visit: Payer: Self-pay

## 2022-01-30 ENCOUNTER — Ambulatory Visit (INDEPENDENT_AMBULATORY_CARE_PROVIDER_SITE_OTHER): Payer: 59 | Admitting: Orthopaedic Surgery

## 2022-01-30 ENCOUNTER — Encounter: Payer: Self-pay | Admitting: Orthopaedic Surgery

## 2022-01-30 ENCOUNTER — Other Ambulatory Visit: Payer: Self-pay

## 2022-01-30 VITALS — Ht 72.0 in | Wt 230.0 lb

## 2022-01-30 DIAGNOSIS — M25562 Pain in left knee: Secondary | ICD-10-CM

## 2022-01-30 DIAGNOSIS — G8929 Other chronic pain: Secondary | ICD-10-CM

## 2022-01-30 DIAGNOSIS — M25561 Pain in right knee: Secondary | ICD-10-CM

## 2022-01-30 MED ORDER — LIDOCAINE HCL 1 % IJ SOLN
5.0000 mL | INTRAMUSCULAR | Status: AC | PRN
  Administered 2022-01-30: 5 mL

## 2022-01-30 MED ORDER — LIDOCAINE HCL 1 % IJ SOLN
2.0000 mL | INTRAMUSCULAR | Status: AC | PRN
  Administered 2022-01-30: 2 mL

## 2022-01-30 MED ORDER — METHYLPREDNISOLONE ACETATE 40 MG/ML IJ SUSP
80.0000 mg | INTRAMUSCULAR | Status: AC | PRN
  Administered 2022-01-30: 80 mg via INTRA_ARTICULAR

## 2022-01-30 NOTE — Progress Notes (Signed)
? ?Office Visit Note ?  ?Patient: Wesley Davenport           ?Date of Birth: 03/02/1961           ?MRN: [id number] ?Visit Date: 01/30/2022 ?             ?Requested by: Claretta Fraise, MD ?[address] [place],  [place] [address] ?PCP: Claretta Fraise, MD ? ? ?Assessment & Plan: ?Visit Diagnoses: No diagnosis found. ? ?Plan: Active pleasant 61 year old gentleman with a 1+ year history of bilateral knee pain right equal to left.  Denies any injuries.  He also has noticed he has gotten more bowlegged.  He is treated this with ibuprofen but is concerned that he is taking too much ibuprofen.  He does get medication from his primary care provider.  X-rays were consistent with advanced varus arthritis of both knees.  He did have a significant effusion in the left knee.  We gave him bilateral cortisone injections also aspirated 35 cc of joint fluid from the left ? ?Follow-Up Instructions: No follow-ups on file.  ? ?Orders:  ?No orders of the defined types were placed in this encounter. ? ?No orders of the defined types were placed in this encounter. ? ? ? ? Procedures: ?Large Joint Inj: bilateral knee on 01/30/2022 2:57 PM ?Indications: pain and diagnostic evaluation ?Details: 25 G 1.5 in needle, anteromedial approach ? ?Arthrogram: No ? ?Medications (Right): 2 mL lidocaine 1 %; 80 mg methylPREDNISolone acetate 40 MG/ML ?Medications (Left): 5 mL lidocaine 1 %; 80 mg methylPREDNISolone acetate 40 MG/ML ?Outcome: tolerated well, no immediate complications ? ?After obtaining verbal consent both knees were prepped on the right knee he was prepped with alcohol and Betadine.  He was injected with betamethasone over the anterior medial aspect of his knee.  On the left knee on the left side he was aspirated 35 cc in the medial patella border.  Followed by injection of betamethasone ?Procedure, treatment alternatives, risks and benefits explained, specific risks discussed. Consent was given by the patient.  ? ? ? ? ?Clinical Data: ?No  additional findings. ? ? ?Subjective: ?Chief Complaint  ?Patient presents with  ? Left Knee - Pain  ? Right Knee - Pain  ?Patient presents today for bilateral knee pain. He said that he has been having pain for a year now. No known injury. Both hurt equally the same. His pain is "in the knees" per patient. He does experiencing giving way with walking, and some swelling. No prior treatment before today. He is taking pain medicine as prescribed by his PCP, but not sure of the name of the medication. He is not diabetic.  ? ?HPI ? ?Review of Systems ? ? ?Objective: ?Vital Signs: There were no vitals taken for this visit. ? ?Physical Exam ?Constitutional:   ?   Appearance: Normal appearance.  ?Pulmonary:  ?   Effort: Pulmonary effort is normal.  ?Neurological:  ?   Mental Status: He is alert.  ?Psychiatric:     ?   Mood and Affect: Mood normal.  ? ?Bilateral knees he has obvious varus malalignment.  He has palpable effusion but no cellulitis on the left.  Possibly small effusion on the right.  He is tender over the medial joint line greater than the lateral joint line.  He does have bilateral grinding.  No erythema or cellulitis noted ? ? ?Specialty Comments:  ?No specialty comments available. ? ?Imaging: ?No results found. ? ? ?PMFS History: ?Patient Active Problem List  ?  Diagnosis Date Noted  ? Left inguinal hernia 01/17/2022  ? Chronic pain of both knees 01/17/2022  ? Hyperlipemia 10/28/2018  ? Restless leg syndrome, controlled 10/27/2018  ? Smoker 10/27/2018  ? ?No past medical history on file.  ?Family History  ?Problem Relation Age of Onset  ? Cancer Mother   ? Heart disease Father   ? Heart disease Brother   ?  ?Past Surgical History:  ?Procedure Laterality Date  ? HEMORRHOID SURGERY    ? ?Social History  ? ?Occupational History  ? Not on file  ?Tobacco Use  ? Smoking status: Every Day  ? Smokeless tobacco: Never  ?Vaping Use  ? Vaping Use: Never used  ?Substance and Sexual Activity  ? Alcohol use: Yes  ?   Comment: occ  ? Drug use: No  ? Sexual activity: Not on file  ? ? ? ? ? ? ?

## 2022-03-27 ENCOUNTER — Encounter (HOSPITAL_COMMUNITY): Payer: Self-pay

## 2022-03-27 NOTE — Progress Notes (Signed)
[facility] Referral received. Attempted to call patient, patient unavailable. Message left with patient's wife who will have the patient call me back. ?

## 2022-06-05 ENCOUNTER — Ambulatory Visit (INDEPENDENT_AMBULATORY_CARE_PROVIDER_SITE_OTHER): Payer: 59 | Admitting: Orthopedic Surgery

## 2022-06-05 ENCOUNTER — Encounter: Payer: Self-pay | Admitting: Orthopedic Surgery

## 2022-06-05 VITALS — Ht 72.0 in | Wt 230.0 lb

## 2022-06-05 DIAGNOSIS — M1711 Unilateral primary osteoarthritis, right knee: Secondary | ICD-10-CM

## 2022-06-05 DIAGNOSIS — M1712 Unilateral primary osteoarthritis, left knee: Secondary | ICD-10-CM

## 2022-06-05 DIAGNOSIS — M170 Bilateral primary osteoarthritis of knee: Secondary | ICD-10-CM

## 2022-06-06 ENCOUNTER — Encounter: Payer: Self-pay | Admitting: Orthopedic Surgery

## 2022-06-06 NOTE — Progress Notes (Signed)
New Patient Visit  Assessment: Wesley Davenport is a 61 y.o. male with the following: 1. Arthritis of left knee 2. Arthritis of right knee  Plan: Nathanuel Cabreja has severe bilateral knee arthritis.  He has varus alignment.  Right is currently worse than left.  He has had good response from previous steroid injections, and he would like to proceed today.  We briefly discussed total knee arthroplasty, but he is not interested at this time.  Procedure note injection Left knee joint   Verbal consent was obtained to inject the left knee joint  Timeout was completed to confirm the site of injection.  The skin was prepped with alcohol and ethyl chloride was sprayed at the injection site.  A 21-gauge needle was used to inject 40 mg of Depo-Medrol and 1% lidocaine (3 cc) into the left knee using an anterolateral approach.  There were no complications. A sterile bandage was applied.     Procedure note injection Right knee joint   Verbal consent was obtained to inject the right knee joint  Timeout was completed to confirm the site of injection.  The skin was prepped with alcohol and ethyl chloride was sprayed at the injection site.  A 21-gauge needle was used to inject 40 mg of Depo-Medrol and 1% lidocaine (3 cc) into the right knee using an anterolateral approach.  There were no complications. A sterile bandage was applied.   Follow-up: No follow-ups on file.  Subjective:  Chief Complaint  Patient presents with   Knee Pain    Bil knee pain, wants injections    History of Present Illness: Wesley Davenport is a 61 y.o. male who presents for evaluation of bilateral knee pain.  Is been ongoing for several years.  Pain is localized to the medial aspect of his knees.  He has had several injections in the past.  Injections have provided 2+ months of relief of his symptoms.  He is not interested in considering surgery at this time.   Review of Systems: No fevers or chills No numbness or  tingling No chest pain No shortness of breath No bowel or bladder dysfunction No GI distress No headaches   Medical History:  No past medical history on file.  Past Surgical History:  Procedure Laterality Date   HEMORRHOID SURGERY      Family History  Problem Relation Age of Onset   Cancer Mother    Heart disease Father    Heart disease Brother    Social History   Tobacco Use   Smoking status: Every Day   Smokeless tobacco: Never  Vaping Use   Vaping Use: Never used  Substance Use Topics   Alcohol use: Yes    Comment: occ   Drug use: No    No Known Allergies  No outpatient medications have been marked as taking for the 06/05/22 encounter (Office Visit) with Oliver Barre, MD.    Objective: Ht 6' (1.829 m)   Wt 230 lb (104.3 kg)   BMI 31.19 kg/m   Physical Exam:  General: Alert and oriented. and No acute distress. Gait: Slow, waddling gait.  Evaluation of bilateral knees demonstrates varus alignment overall.  He has prominent veins over the anterior aspect of bilateral knees.  Tenderness to palpation within the medial joint line.  Varus deformity is fixed.  Negative Lachman.  Sensation is intact distally.  IMAGING: I personally reviewed images previously obtained in clinic    Alignment bilaterally.  Complete loss of joint space medially.  Associated osteophytes.   New Medications:  No orders of the defined types were placed in this encounter.     Oliver Barre, MD  06/06/2022 3:21 PM

## 2022-06-20 ENCOUNTER — Encounter: Payer: Self-pay | Admitting: Family Medicine

## 2022-06-20 ENCOUNTER — Ambulatory Visit (INDEPENDENT_AMBULATORY_CARE_PROVIDER_SITE_OTHER): Payer: 59 | Admitting: Family Medicine

## 2022-06-20 VITALS — BP 151/89 | HR 68 | Temp 99.8°F | Ht 72.0 in | Wt 229.4 lb

## 2022-06-20 DIAGNOSIS — E782 Mixed hyperlipidemia: Secondary | ICD-10-CM

## 2022-06-20 DIAGNOSIS — M5416 Radiculopathy, lumbar region: Secondary | ICD-10-CM | POA: Diagnosis not present

## 2022-06-20 DIAGNOSIS — M461 Sacroiliitis, not elsewhere classified: Secondary | ICD-10-CM | POA: Diagnosis not present

## 2022-06-20 DIAGNOSIS — G2581 Restless legs syndrome: Secondary | ICD-10-CM

## 2022-06-20 MED ORDER — PREDNISONE 20 MG PO TABS: ORAL_TABLET | ORAL | 0 refills | Status: DC

## 2022-06-20 MED ORDER — DICLOFENAC SODIUM 75 MG PO TBEC: 75.0000 mg | DELAYED_RELEASE_TABLET | Freq: Two times a day (BID) | ORAL | 2 refills | Status: DC

## 2022-06-20 MED ORDER — PRAMIPEXOLE DIHYDROCHLORIDE 1.5 MG PO TABS: 1.5000 mg | ORAL_TABLET | Freq: Three times a day (TID) | ORAL | 2 refills | Status: DC

## 2022-06-20 MED ORDER — ATORVASTATIN CALCIUM 20 MG PO TABS: 20.0000 mg | ORAL_TABLET | Freq: Every day | ORAL | 3 refills | Status: DC

## 2022-06-20 NOTE — Patient Instructions (Signed)

## 2022-06-20 NOTE — Progress Notes (Signed)
Subjective:  Patient ID: Wesley Davenport, male    DOB: May 20, 1961  Age: 61 y.o. MRN: [id number]  CC: No chief complaint on file.   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 Also knees are shot. Considering disability. Social security not enough to live on.   Having shots in his knees every three months.   Not getting treatment for back. Uses a brace but it doesn't help. Right low baaCK PAIN NEAR HIP ADDITIONALLY POSSIBLY SCIATICA. Points to sacroiliac on right. Denies radiation.Takes a lot of advil. Diclofenac is not effective. "Like taking candy." Having cramps in legs randomly moving around. In hands at first a year ago. Legs started 3-4 months ago. Increasing in frequency.   Wants to know how to get a handicap card.   Just started chantix a week ago.        06/20/2022    1:25 PM 06/20/2022    1:19 PM 01/17/2022    1:55 PM  Depression screen PHQ 2/9  Decreased Interest 0 0 0  Down, Depressed, Hopeless 0 0 0  PHQ - 2 Score 0 0 0  Altered sleeping 3  3  Tired, decreased energy 1  0  Change in appetite 0  0  Feeling bad or failure about yourself  0  0  Trouble concentrating 0  0  Moving slowly or fidgety/restless 3  0  Suicidal thoughts 0  0  PHQ-9 Score 7  3  Difficult doing work/chores Extremely dIfficult  Extremely dIfficult    History Wesley Davenport has no past medical history on file.   He has a past surgical history that includes Hemorrhoid surgery.   His family history includes Cancer in his mother; Heart disease in his brother and father.He reports that he has been smoking. He has never used smokeless tobacco. He reports current alcohol use. He reports that he does not use drugs.    ROS Review of Systems  Constitutional:  Negative for fever.  Respiratory:  Negative for shortness of breath.   Cardiovascular:  Negative for chest pain.  Musculoskeletal:  Positive for arthralgias (knees) and back pain.  Skin:   Negative for rash.    Objective:  BP (!) 151/89   Pulse 68   Temp 99.8 F (37.7 C)   Ht 6' (1.829 m)   Wt 229 lb 6.4 oz (104.1 kg)   SpO2 94%   BMI 31.11 kg/m   BP Readings from Last 3 Encounters:  06/20/22 (!) 151/89  01/17/22 (!) 158/84  07/19/21 (!) 158/101    Wt Readings from Last 3 Encounters:  06/20/22 229 lb 6.4 oz (104.1 kg)  06/05/22 230 lb (104.3 kg)  01/30/22 230 lb (104.3 kg)     Physical Exam Vitals reviewed.  Constitutional:      Appearance: He is well-developed.  HENT:     Head: Normocephalic and atraumatic.     Right Ear: External ear normal.     Left Ear: External ear normal.     Mouth/Throat:     Pharynx: No oropharyngeal exudate or posterior oropharyngeal erythema.  Eyes:     Pupils: Pupils are equal, round, and reactive to light.  Cardiovascular:     Rate and Rhythm: Normal rate and regular rhythm.     Heart sounds: No murmur heard. Pulmonary:     Effort: No respiratory distress.     Breath sounds: Normal breath sounds.  Musculoskeletal:  General: Tenderness (Right SI joint) present.     Cervical back: Normal range of motion and neck supple.  Neurological:     Mental Status: He is alert and oriented to person, place, and time.       Assessment & Plan:   Diagnoses and all orders for this visit:  Lumbar radiculopathy -     MR LUMBAR SPINE WO CONTRAST; Future  Mixed hyperlipidemia -     atorvastatin (LIPITOR) 20 MG tablet; Take 1 tablet (20 mg total) by mouth daily.  Restless leg syndrome, uncontrolled -     pramipexole (MIRAPEX) 1.5 MG tablet; Take 1 tablet (1.5 mg total) by mouth 3 (three) times daily.  Sacroiliitis (HCC)  Other orders -     diclofenac (VOLTAREN) 75 MG EC tablet; Take 1 tablet (75 mg total) by mouth 2 (two) times daily. For muscle and  Joint pain -     predniSONE (DELTASONE) 20 MG tablet; One twice daily with food for 2 weeks. Then one daily for 2 weeks       I have changed Caryn Bee Simonis's  pramipexole. I am also having him start on predniSONE. Additionally, I am having him maintain his sildenafil, Chantix Starting Month Pak, varenicline, carbidopa-levodopa, atorvastatin, and diclofenac.  Allergies as of 06/20/2022   No Known Allergies      Medication List        Accurate as of June 20, 2022  5:13 PM. If you have any questions, ask your nurse or doctor.          atorvastatin 20 MG tablet Commonly known as: LIPITOR Take 1 tablet (20 mg total) by mouth daily.   carbidopa-levodopa 50-200 MG tablet Commonly known as: SINEMET CR Take 2 tablets by mouth 2 (two) times daily.   Chantix Starting Month Pak 0.5 MG X 11 & 1 MG X 42 Tbpk Generic drug: Varenicline Tartrate (Starter) Take 1 tablet by mouth 2 (two) times daily. Use according to package directions   varenicline 1 MG tablet Commonly known as: Chantix Continuing Month Pak Take 1 tablet (1 mg total) by mouth 2 (two) times daily.   diclofenac 75 MG EC tablet Commonly known as: VOLTAREN Take 1 tablet (75 mg total) by mouth 2 (two) times daily. For muscle and  Joint pain   pramipexole 1.5 MG tablet Commonly known as: MIRAPEX Take 1 tablet (1.5 mg total) by mouth 3 (three) times daily. What changed:  medication strength how much to take Changed by: Mechele Claude, MD   predniSONE 20 MG tablet Commonly known as: DELTASONE One twice daily with food for 2 weeks. Then one daily for 2 weeks Started by: Mechele Claude, MD   sildenafil 20 MG tablet Commonly known as: REVATIO Take 2-5 pills at once, orally, with each sexual encounter         Follow-up: No follow-ups on file.  Mechele Claude, M.D.

## 2022-09-04 ENCOUNTER — Ambulatory Visit: Payer: Self-pay | Admitting: Orthopedic Surgery

## 2022-10-23 ENCOUNTER — Encounter: Payer: Self-pay | Admitting: Orthopedic Surgery

## 2022-10-23 ENCOUNTER — Ambulatory Visit (INDEPENDENT_AMBULATORY_CARE_PROVIDER_SITE_OTHER): Payer: 59 | Admitting: Orthopedic Surgery

## 2022-10-23 VITALS — Ht 72.0 in | Wt 225.0 lb

## 2022-10-23 DIAGNOSIS — M1712 Unilateral primary osteoarthritis, left knee: Secondary | ICD-10-CM

## 2022-10-23 DIAGNOSIS — M1711 Unilateral primary osteoarthritis, right knee: Secondary | ICD-10-CM

## 2022-10-23 MED ORDER — METHYLPREDNISOLONE ACETATE 40 MG/ML IJ SUSP
40.0000 mg | Freq: Once | INTRAMUSCULAR | Status: AC
  Administered 2022-10-23: 40 mg via INTRA_ARTICULAR

## 2022-10-23 NOTE — Progress Notes (Signed)
Return patient Visit  Assessment: Wesley Davenport is a 61 y.o. male with the following: 1. Arthritis of left knee 2. 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  He has varus alignment.  Left is currently worse than right.  Prior injections helped, but were not as effective overall in his previous injections.  He is still interested in steroid injection today.  We did discuss hyaluronic acid injections.  He is aware of the process.  If he wishes to try these injections, he will contact the clinic in advance.  Follow-up as needed.   Procedure note injection Left knee joint   Verbal consent was obtained to inject the left knee joint  Timeout was completed to confirm the site of injection.  The skin was prepped with alcohol and ethyl chloride was sprayed at the injection site.  A 21-gauge needle was used to inject 40 mg of Depo-Medrol and 1% lidocaine (3 cc) into the left knee using an anterolateral approach.  There were no complications. A sterile bandage was applied.     Procedure note injection Right knee joint   Verbal consent was obtained to inject the right knee joint  Timeout was completed to confirm the site of injection.  The skin was prepped with alcohol and ethyl chloride was sprayed at the injection site.  A 21-gauge needle was used to inject 40 mg of Depo-Medrol and 1% lidocaine (3 cc) into the right knee using an anterolateral approach.  There were no complications. A sterile bandage was applied.   Follow-up: No follow-ups on file.  Subjective:  Chief Complaint  Patient presents with   Knee Pain    Bilat knee pain, last injections only lasted 1-2 wks.     History of Present Illness: Wesley Davenport is a 61 y.o. male who returns for evaluation of bilateral knee pain.  He states the most recent injections helped, but only for a few weeks, especially on the left.  Nonetheless, he is interested in repeat injections.  He is not interested  in considering surgery.  He continues to have severe pain in the medial aspect of both knees.  Review of Systems: No fevers or chills No numbness or tingling No chest pain No shortness of breath No bowel or bladder dysfunction No GI distress No headaches   Objective: Ht 6' (1.829 m)   Wt 225 lb (102.1 kg)   BMI 30.52 kg/m   Physical Exam:  General: Alert and oriented. and No acute distress. Gait: Slow, waddling gait.  Evaluation of bilateral knees demonstrates varus alignment overall.  He has prominent veins over the anterior aspect of bilateral knees.  Tenderness to palpation within the medial joint line.  Varus deformity is fixed.  Negative Lachman.  Sensation is intact distally.  IMAGING: I personally reviewed images previously obtained in clinic    Varus Alignment bilaterally.  Complete loss of joint space medially.  Associated osteophytes.   New Medications:  No orders of the defined types were placed in this encounter.     Oliver Barre, MD  10/23/2022 10:22 AM

## 2022-11-19 NOTE — Progress Notes (Signed)
[facility] referral received. I have attempted to reach the patient regarding referral but was unable to reach the patient directly. Detailed messaged left with patient's wife, requesting that the patient call me back.

## 2023-01-01 NOTE — Progress Notes (Signed)
[facility] referral received, called placed to patient and spoke with his wife who requests that the referral be closed as the patient is not interested. Referral closed at this time.

## 2023-01-09 ENCOUNTER — Encounter: Payer: Self-pay | Admitting: Radiology

## 2023-06-02 ENCOUNTER — Other Ambulatory Visit: Payer: Self-pay | Admitting: *Deleted

## 2023-06-02 ENCOUNTER — Telehealth: Payer: Self-pay | Admitting: Family Medicine

## 2023-06-02 DIAGNOSIS — G2581 Restless legs syndrome: Secondary | ICD-10-CM

## 2023-06-02 MED ORDER — PRAMIPEXOLE DIHYDROCHLORIDE 1.5 MG PO TABS: 1.5000 mg | ORAL_TABLET | Freq: Three times a day (TID) | ORAL | 0 refills | Status: DC

## 2023-06-02 NOTE — Telephone Encounter (Signed)
  Prescription Request  06/02/2023  Is this a "Controlled Substance" medicine? no  Have you seen your PCP in the last 2 weeks? No appt is on 07/03/23  If YES, route message to pool  -  If NO, patient needs to be scheduled for appointment.  What is the name of the medication or equipment? pramipexole (MIRAPEX) 1.5 MG tablet   Have you contacted your pharmacy to request a refill? yes   Which pharmacy would you like this sent to? [facility]   Patient notified that their request is being sent to the clinical staff for review and that they should receive a response within 2 business days.

## 2023-06-02 NOTE — Telephone Encounter (Signed)
One month refill sent.

## 2023-07-03 ENCOUNTER — Ambulatory Visit: Payer: Commercial Managed Care - HMO | Admitting: Family Medicine

## 2023-07-03 ENCOUNTER — Encounter: Payer: Self-pay | Admitting: Family Medicine

## 2023-07-03 VITALS — BP 131/81 | HR 71 | Temp 96.4°F | Resp 20 | Ht 72.0 in | Wt 232.0 lb

## 2023-07-03 DIAGNOSIS — N529 Male erectile dysfunction, unspecified: Secondary | ICD-10-CM

## 2023-07-03 DIAGNOSIS — Z125 Encounter for screening for malignant neoplasm of prostate: Secondary | ICD-10-CM

## 2023-07-03 DIAGNOSIS — M7122 Synovial cyst of popliteal space [Baker], left knee: Secondary | ICD-10-CM

## 2023-07-03 DIAGNOSIS — E559 Vitamin D deficiency, unspecified: Secondary | ICD-10-CM | POA: Diagnosis not present

## 2023-07-03 DIAGNOSIS — E782 Mixed hyperlipidemia: Secondary | ICD-10-CM

## 2023-07-03 DIAGNOSIS — G2581 Restless legs syndrome: Secondary | ICD-10-CM

## 2023-07-03 DIAGNOSIS — M7121 Synovial cyst of popliteal space [Baker], right knee: Secondary | ICD-10-CM

## 2023-07-03 MED ORDER — ATORVASTATIN CALCIUM 20 MG PO TABS: 20.0000 mg | ORAL_TABLET | Freq: Every day | ORAL | 3 refills | Status: DC

## 2023-07-03 MED ORDER — PRAMIPEXOLE DIHYDROCHLORIDE 1.5 MG PO TABS: 1.5000 mg | ORAL_TABLET | Freq: Three times a day (TID) | ORAL | 3 refills | Status: DC

## 2023-07-03 MED ORDER — CARBIDOPA-LEVODOPA ER 50-200 MG PO TBCR: 2.0000 | EXTENDED_RELEASE_TABLET | Freq: Two times a day (BID) | ORAL | 3 refills | Status: DC

## 2023-07-03 MED ORDER — DICLOFENAC SODIUM 75 MG PO TBEC: 75.0000 mg | DELAYED_RELEASE_TABLET | Freq: Two times a day (BID) | ORAL | 3 refills | Status: DC

## 2023-07-03 MED ORDER — SILDENAFIL CITRATE 20 MG PO TABS: ORAL_TABLET | ORAL | 3 refills | Status: DC

## 2023-07-03 NOTE — Patient Instructions (Addendum)
Consider flexogenics for knees. Stay off of them as much as possible Apply heat frequently, May use ice after exercise Consider OTC OsteobiflexKnee Exercises   Ask your health care provider which exercises are safe for you. Do exercises exactly as told by your health care provider and adjust them as directed. It is normal to feel mild stretching, pulling, tightness, or discomfort as you do these exercises. Stop right away if you feel sudden pain or your pain gets worse. Do not begin these exercises until told by your health care provider. Stretching and range-of-motion exercises These exercises warm up your muscles and joints and improve the movement and flexibility of your knee. These exercises also help to relieve pain and swelling. Knee extension, prone  Lie on your abdomen (prone position) on a bed. Place your left / right knee just beyond the edge of the surface so your knee is not on the bed. You can put a towel under your left / right thigh just above your kneecap for comfort. Relax your leg muscles and allow gravity to straighten your knee (extension). You should feel a stretch behind your left / right knee. Hold this position for __________ seconds. Scoot up so your knee is supported between repetitions. Repeat __________ times. Complete this exercise __________ times a day. Knee flexion, active  Lie on your back with both legs straight. If this causes back discomfort, bend your left / right knee so your foot is flat on the floor. Slowly slide your left / right heel back toward your buttocks. Stop when you feel a gentle stretch in the front of your knee or thigh (flexion). Hold this position for __________ seconds. Slowly slide your left / right heel back to the starting position. Repeat __________ times. Complete this exercise __________ times a day. Quadriceps stretch, prone  Lie on your abdomen on a firm surface, such as a bed or padded floor. Bend your left / right knee and  hold your ankle. If you cannot reach your ankle or pant leg, loop a belt around your foot and grab the belt instead. Gently pull your heel toward your buttocks. Your knee should not slide out to the side. You should feel a stretch in the front of your thigh and knee (quadriceps). Hold this position for __________ seconds. Repeat __________ times. Complete this exercise __________ times a day. Hamstring, supine  Lie on your back (supine position). Loop a belt or towel over the ball of your left / right foot. The ball of your foot is on the walking surface, right under your toes. Straighten your left / right knee and slowly pull on the belt to raise your leg until you feel a gentle stretch behind your knee (hamstring). Do not let your knee bend while you do this. Keep your other leg flat on the floor. Hold this position for __________ seconds. Repeat __________ times. Complete this exercise __________ times a day. Strengthening exercises These exercises build strength and endurance in your knee. Endurance is the ability to use your muscles for a long time, even after they get tired. Quadriceps, isometric This exercise strengthens the muscles in front of your thigh (quadriceps) without moving your knee joint (isometric). Lie on your back with your left / right leg extended and your other knee bent. Put a rolled towel or small pillow under your knee if told by your health care provider. Slowly tense the muscles in the front of your left / right thigh. You should see your kneecap slide  up toward your hip or see increased dimpling just above the knee. This motion will push the back of the knee toward the floor. For __________ seconds, hold the muscle as tight as you can without increasing your pain. Relax the muscles slowly and completely. Repeat __________ times. Complete this exercise __________ times a day. Straight leg raises This exercise strengthens the muscles in front of your thigh  (quadriceps) and the muscles that move your hips (hip flexors). Lie on your back with your left / right leg extended and your other knee bent. Tense the muscles in the front of your left / right thigh. You should see your kneecap slide up or see increased dimpling just above the knee. Your thigh may even shake a bit. Keep these muscles tight as you raise your leg 4-6 inches (10-15 cm) off the floor. Do not let your knee bend. Hold this position for __________ seconds. Keep these muscles tense as you lower your leg. Relax your muscles slowly and completely after each repetition. Repeat __________ times. Complete this exercise __________ times a day. Hamstring, isometric  Lie on your back on a firm surface. Bend your left / right knee about __________ degrees. Dig your left / right heel into the surface as if you are trying to pull it toward your buttocks. Tighten the muscles in the back of your thighs (hamstring) to "dig" as hard as you can without increasing any pain. Hold this position for __________ seconds. Release the tension gradually and allow your muscles to relax completely for __________ seconds after each repetition. Repeat __________ times. Complete this exercise __________ times a day. Hamstring curls If told by your health care provider, do this exercise while wearing ankle weights. Begin with __________lb / kg weights. Then increase the weight by 1 lb (0.5 kg) increments. Do not wear ankle weights that are more than __________lb / kg. Lie on your abdomen with your legs straight. Bend your left / right knee as far as you can without feeling pain. Keep your hips flat against the floor. Hold this position for __________ seconds. Slowly lower your leg to the starting position. Repeat __________ times. Complete this exercise __________ times a day. Squats This exercise strengthens the muscles in front of your thigh and knee (quadriceps). Stand in front of a table, with your feet and  knees pointing straight ahead. You may rest your hands on the table for balance but not for support. Slowly bend your knees and lower your hips like you are going to sit in a chair. Keep your weight over your heels, not over your toes. Keep your lower legs upright so they are parallel with the table legs. Do not let your hips go lower than your knees. Do not bend lower than told by your health care provider. If your knee pain increases, do not bend as low. Hold the squat position for __________ seconds. Slowly push with your legs to return to standing. Do not use your hands to pull yourself to standing. Repeat __________ times. Complete this exercise __________ times a day. Wall slides This exercise strengthens the muscles in front of your thigh and knee (quadriceps). Lean your back against a smooth wall or door, and walk your feet out 18-24 inches (46-61 cm) from it. Place your feet hip-width apart. Slowly slide down the wall or door until your knees bend __________ degrees. Keep your knees over your heels, not over your toes. Keep your knees in line with your hips. Hold this position  for __________ seconds. Repeat __________ times. Complete this exercise __________ times a day. Straight leg raises, side-lying This exercise strengthens the muscles that rotate the leg at the hip and move it away from your body (hip abductors). Lie on your side with your left / right leg in the top position. Lie so your head, shoulder, knee, and hip line up. You may bend your bottom knee to help you keep your balance. Roll your hips slightly forward so your hips are stacked directly over each other and your left / right knee is facing forward. Leading with your heel, lift your top leg 4-6 inches (10-15 cm). You should feel the muscles in your outer hip lifting. Do not let your foot drift forward. Do not let your knee roll toward the ceiling. Hold this position for __________ seconds. Slowly return your leg to  the starting position. Let your muscles relax completely after each repetition. Repeat __________ times. Complete this exercise __________ times a day. Straight leg raises, prone This exercise stretches the muscles that move your hips away from the front of the pelvis (hip extensors). Lie on your abdomen on a firm surface. You can put a pillow under your hips if that is more comfortable. Tense the muscles in your buttocks and lift your left / right leg about 4-6 inches (10-15 cm). Keep your knee straight as you lift your leg. Hold this position for __________ seconds. Slowly lower your leg to the starting position. Let your leg relax completely after each repetition. Repeat __________ times. Complete this exercise __________ times a day. This information is not intended to replace advice given to you by your health care provider. Make sure you discuss any questions you have with your health care provider. Document Revised: 07/10/2021 Document Reviewed: 07/10/2021 Elsevier Patient Education  2024 [employer].

## 2023-07-03 NOTE — Progress Notes (Signed)
Subjective:  Patient ID: Wesley Davenport, male    DOB: 1961/08/04  Age: 62 y.o. MRN: [id number]  CC: Medical Management of Chronic Issues   HPI Wesley Davenport presents for Knee pain not improved by injections. Currently can hardly walk.  Pt. Contemplating dc of smoking Has chantix but hasn't started taking it. Points to posterior joints as point of pain.   Legs cramp as well. Some relief with mirapex and cabidopa/ levodopa.   in for follow-up of elevated cholesterol. Doing well without complaints on current medication. Denies side effects of statin including myalgia and arthralgia and nausea. Currently no chest pain, shortness of breath or other cardiovascular related symptoms noted.  Having ED needs med renewed not sure how to take it.   .       07/03/2023   10:36 AM 06/20/2022    1:25 PM 06/20/2022    1:19 PM  Depression screen PHQ 2/9  Decreased Interest 0 0 0  Down, Depressed, Hopeless 0 0 0  PHQ - 2 Score 0 0 0  Altered sleeping 3 3   Tired, decreased energy 0 1   Change in appetite 0 0   Feeling bad or failure about yourself  0 0   Trouble concentrating 0 0   Moving slowly or fidgety/restless 0 3   Suicidal thoughts 0 0   PHQ-9 Score 3 7   Difficult doing work/chores Somewhat difficult Extremely dIfficult     History Wesley Davenport has no past medical history on file.   He has a past surgical history that includes Hemorrhoid surgery.   His family history includes Cancer in his mother; Heart disease in his brother and father.He reports that he has been smoking. He has never used smokeless tobacco. He reports current alcohol use. He reports that he does not use drugs.    ROS Review of Systems  Objective:  BP 131/81   Pulse 71   Temp (!) 96.4 F (35.8 C) (Oral)   Resp 20   Ht 6' (1.829 m)   Wt 232 lb (105.2 kg)   SpO2 93%   BMI 31.46 kg/m   BP Readings from Last 3 Encounters:  07/03/23 131/81  06/20/22 (!) 151/89  01/17/22 (!) 158/84    Wt Readings from Last  3 Encounters:  07/03/23 232 lb (105.2 kg)  10/23/22 225 lb (102.1 kg)  06/20/22 229 lb 6.4 oz (104.1 kg)     Physical Exam Vitals reviewed.  Constitutional:      Appearance: He is well-developed.  HENT:     Head: Normocephalic and atraumatic.     Right Ear: External ear normal.     Left Ear: External ear normal.     Mouth/Throat:     Pharynx: No oropharyngeal exudate or posterior oropharyngeal erythema.  Eyes:     Pupils: Pupils are equal, round, and reactive to light.  Cardiovascular:     Rate and Rhythm: Normal rate and regular rhythm.     Heart sounds: No murmur heard. Pulmonary:     Effort: No respiratory distress.     Breath sounds: Normal breath sounds.  Musculoskeletal:        General: Tenderness (bilateral popliteal fossae) present.     Cervical back: Normal range of motion and neck supple.  Neurological:     Mental Status: He is alert and oriented to person, place, and time.       Assessment & Plan:   Wesley Davenport was seen today for medical management of chronic issues.  Diagnoses and all orders for this visit:  Vitamin D deficiency -     VITAMIN D 25 Hydroxy (Vit-D Deficiency, Fractures)  Restless leg syndrome, controlled -     carbidopa-levodopa (SINEMET CR) 50-200 MG tablet; Take 2 tablets by mouth 2 (two) times daily. -     pramipexole (MIRAPEX) 1.5 MG tablet; Take 1 tablet (1.5 mg total) by mouth 3 (three) times daily. -     CBC with Differential/Platelet -     CMP14+EGFR  Vasculogenic erectile dysfunction, unspecified vasculogenic erectile dysfunction type -     sildenafil (REVATIO) 20 MG tablet; Take 2-5 pills at once, orally, with each sexual encounter -     CBC with Differential/Platelet -     CMP14+EGFR  Mixed hyperlipidemia -     atorvastatin (LIPITOR) 20 MG tablet; Take 1 tablet (20 mg total) by mouth daily. For cholesterol -     CBC with Differential/Platelet -     CMP14+EGFR -     Lipid panel  Screening for prostate cancer -     PSA, total  and free  Synovial cyst of left popliteal space -     diclofenac (VOLTAREN) 75 MG EC tablet; Take 1 tablet (75 mg total) by mouth 2 (two) times daily. For muscle and  Joint pain  Baker's cyst of knee, right -     diclofenac (VOLTAREN) 75 MG EC tablet; Take 1 tablet (75 mg total) by mouth 2 (two) times daily. For muscle and  Joint pain       I have discontinued Wesley Davenport's predniSONE. I have also changed his atorvastatin. Additionally, I am having him maintain his Chantix Starting Month Pak, varenicline, carbidopa-levodopa, diclofenac, pramipexole, and sildenafil.  Allergies as of 07/03/2023   No Known Allergies      Medication List        Accurate as of July 03, 2023 11:59 PM. If you have any questions, ask your nurse or doctor.          STOP taking these medications    predniSONE 20 MG tablet Commonly known as: DELTASONE Stopped by: Finn Altemose       TAKE these medications    atorvastatin 20 MG tablet Commonly known as: LIPITOR Take 1 tablet (20 mg total) by mouth daily. For cholesterol What changed: additional instructions Changed by: Nekisha Mcdiarmid   carbidopa-levodopa 50-200 MG tablet Commonly known as: SINEMET CR Take 2 tablets by mouth 2 (two) times daily.   Chantix Starting Month Pak 0.5 MG X 11 & 1 MG X 42 Tbpk Generic drug: Varenicline Tartrate (Starter) Take 1 tablet by mouth 2 (two) times daily. Use according to package directions   varenicline 1 MG tablet Commonly known as: Chantix Continuing Month Pak Take 1 tablet (1 mg total) by mouth 2 (two) times daily.   diclofenac 75 MG EC tablet Commonly known as: VOLTAREN Take 1 tablet (75 mg total) by mouth 2 (two) times daily. For muscle and  Joint pain   pramipexole 1.5 MG tablet Commonly known as: MIRAPEX Take 1 tablet (1.5 mg total) by mouth 3 (three) times daily.   sildenafil 20 MG tablet Commonly known as: REVATIO Take 2-5 pills at once, orally, with each sexual encounter          Follow-up: Return in about 6 months (around 01/03/2024).  Mechele Claude, M.D.

## 2023-07-04 ENCOUNTER — Encounter: Payer: Self-pay | Admitting: Family Medicine

## 2023-07-04 LAB — CMP14+EGFR
ALT: 21 IU/L (ref 0–44)
AST: 21 IU/L (ref 0–40)
Albumin: 4.3 g/dL (ref 3.9–4.9)
Alkaline Phosphatase: 91 IU/L (ref 44–121)
BUN/Creatinine Ratio: 10 (ref 10–24)
BUN: 10 mg/dL (ref 8–27)
Bilirubin Total: 0.4 mg/dL (ref 0.0–1.2)
CO2: 22 mmol/L (ref 20–29)
Calcium: 9 mg/dL (ref 8.6–10.2)
Chloride: 101 mmol/L (ref 96–106)
Creatinine, Ser: 1 mg/dL (ref 0.76–1.27)
Globulin, Total: 2.6 g/dL (ref 1.5–4.5)
Glucose: 94 mg/dL (ref 70–99)
Potassium: 4.4 mmol/L (ref 3.5–5.2)
Sodium: 139 mmol/L (ref 134–144)
Total Protein: 6.9 g/dL (ref 6.0–8.5)
eGFR: 84 mL/min/{1.73_m2} (ref >59)

## 2023-07-04 LAB — PSA, TOTAL AND FREE
PSA, Free Pct: 25 %
PSA, Free: 0.1 ng/mL
Prostate Specific Ag, Serum: 0.4 ng/mL (ref 0.0–4.0)

## 2023-07-04 LAB — CBC WITH DIFFERENTIAL/PLATELET
Basophils Absolute: 0 10*3/uL (ref 0.0–0.2)
Basos: 0 %
EOS (ABSOLUTE): 0.1 10*3/uL (ref 0.0–0.4)
Eos: 1 %
Hematocrit: 48 % (ref 37.5–51.0)
Hemoglobin: 15.9 g/dL (ref 13.0–17.7)
Immature Grans (Abs): 0 10*3/uL (ref 0.0–0.1)
Immature Granulocytes: 0 %
Lymphocytes Absolute: 2.5 10*3/uL (ref 0.7–3.1)
Lymphs: 21 %
MCH: 30.5 pg (ref 26.6–33.0)
MCHC: 33.1 g/dL (ref 31.5–35.7)
MCV: 92 fL (ref 79–97)
Monocytes Absolute: 0.9 10*3/uL (ref 0.1–0.9)
Monocytes: 7 %
Neutrophils Absolute: 8.2 10*3/uL — ABNORMAL HIGH (ref 1.4–7.0)
Neutrophils: 71 %
Platelets: 268 10*3/uL (ref 150–450)
RBC: 5.22 x10E6/uL (ref 4.14–5.80)
RDW: 13 % (ref 11.6–15.4)
WBC: 11.7 10*3/uL — ABNORMAL HIGH (ref 3.4–10.8)

## 2023-07-04 LAB — LIPID PANEL
Chol/HDL Ratio: 3.9 ratio (ref 0.0–5.0)
Cholesterol, Total: 204 mg/dL — ABNORMAL HIGH (ref 100–199)
HDL: 52 mg/dL (ref >39)
LDL Chol Calc (NIH): 141 mg/dL — ABNORMAL HIGH (ref 0–99)
Triglycerides: 63 mg/dL (ref 0–149)
VLDL Cholesterol Cal: 11 mg/dL (ref 5–40)

## 2023-07-04 LAB — VITAMIN D 25 HYDROXY (VIT D DEFICIENCY, FRACTURES): Vit D, 25-Hydroxy: 17.1 ng/mL — ABNORMAL LOW (ref 30.0–100.0)

## 2023-07-07 NOTE — Progress Notes (Signed)
Dear Wesley Davenport, Your Vitamin D is  low. You need a prescription strength supplement I will send that in for you.  Nurse, if at all possible, could you send in a prescription for the patient for vitamin D 50,000 units, 1 p.o. weekly #13 with 3 refills? Many thanks, WS

## 2023-07-10 ENCOUNTER — Other Ambulatory Visit: Payer: Self-pay

## 2023-07-10 MED ORDER — VITAMIN D (ERGOCALCIFEROL) 1.25 MG (50000 UNIT) PO CAPS: 50000.0000 [IU] | ORAL_CAPSULE | ORAL | 3 refills | Status: DC

## 2023-07-29 IMAGING — DX DG KNEE 1-2V*R*
2 series · 2 of 2 positions shown · non-contrast
Comparison: None.

CLINICAL DATA: Right knee pain.

EXAM:
RIGHT KNEE - 1-2 VIEW

[knee ap]
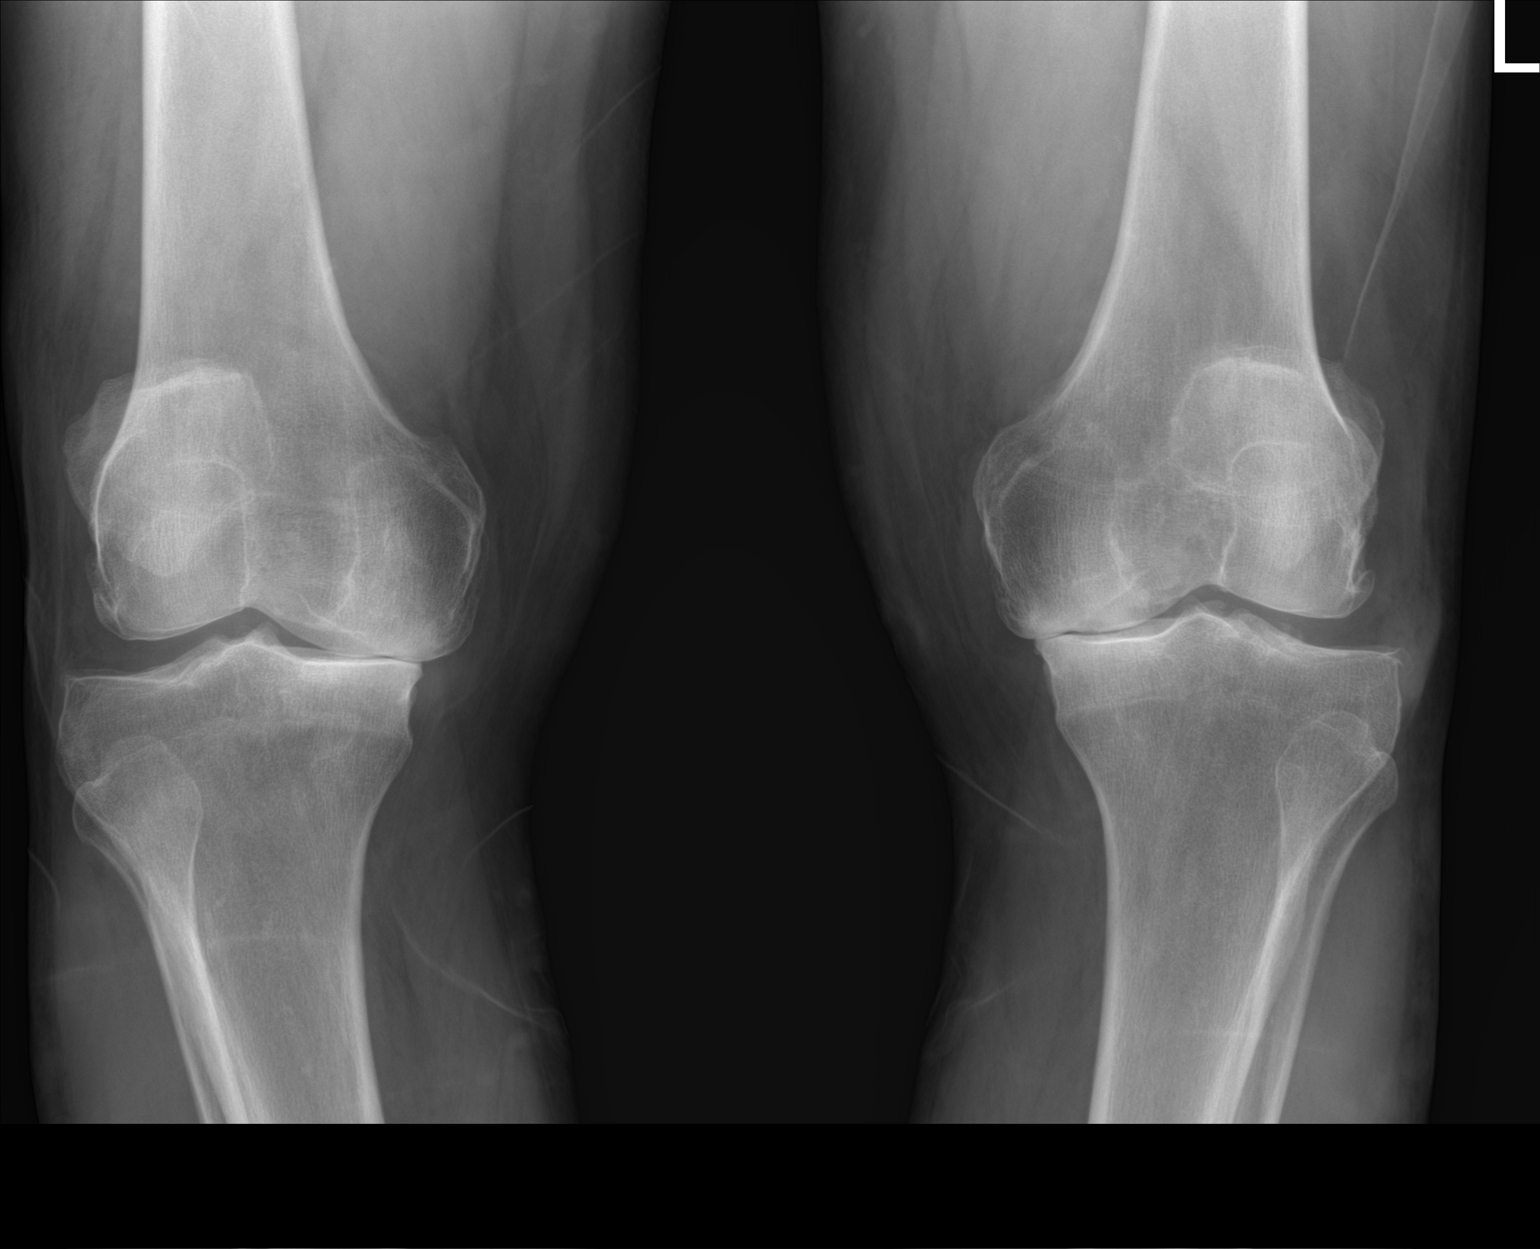

[knee lat]
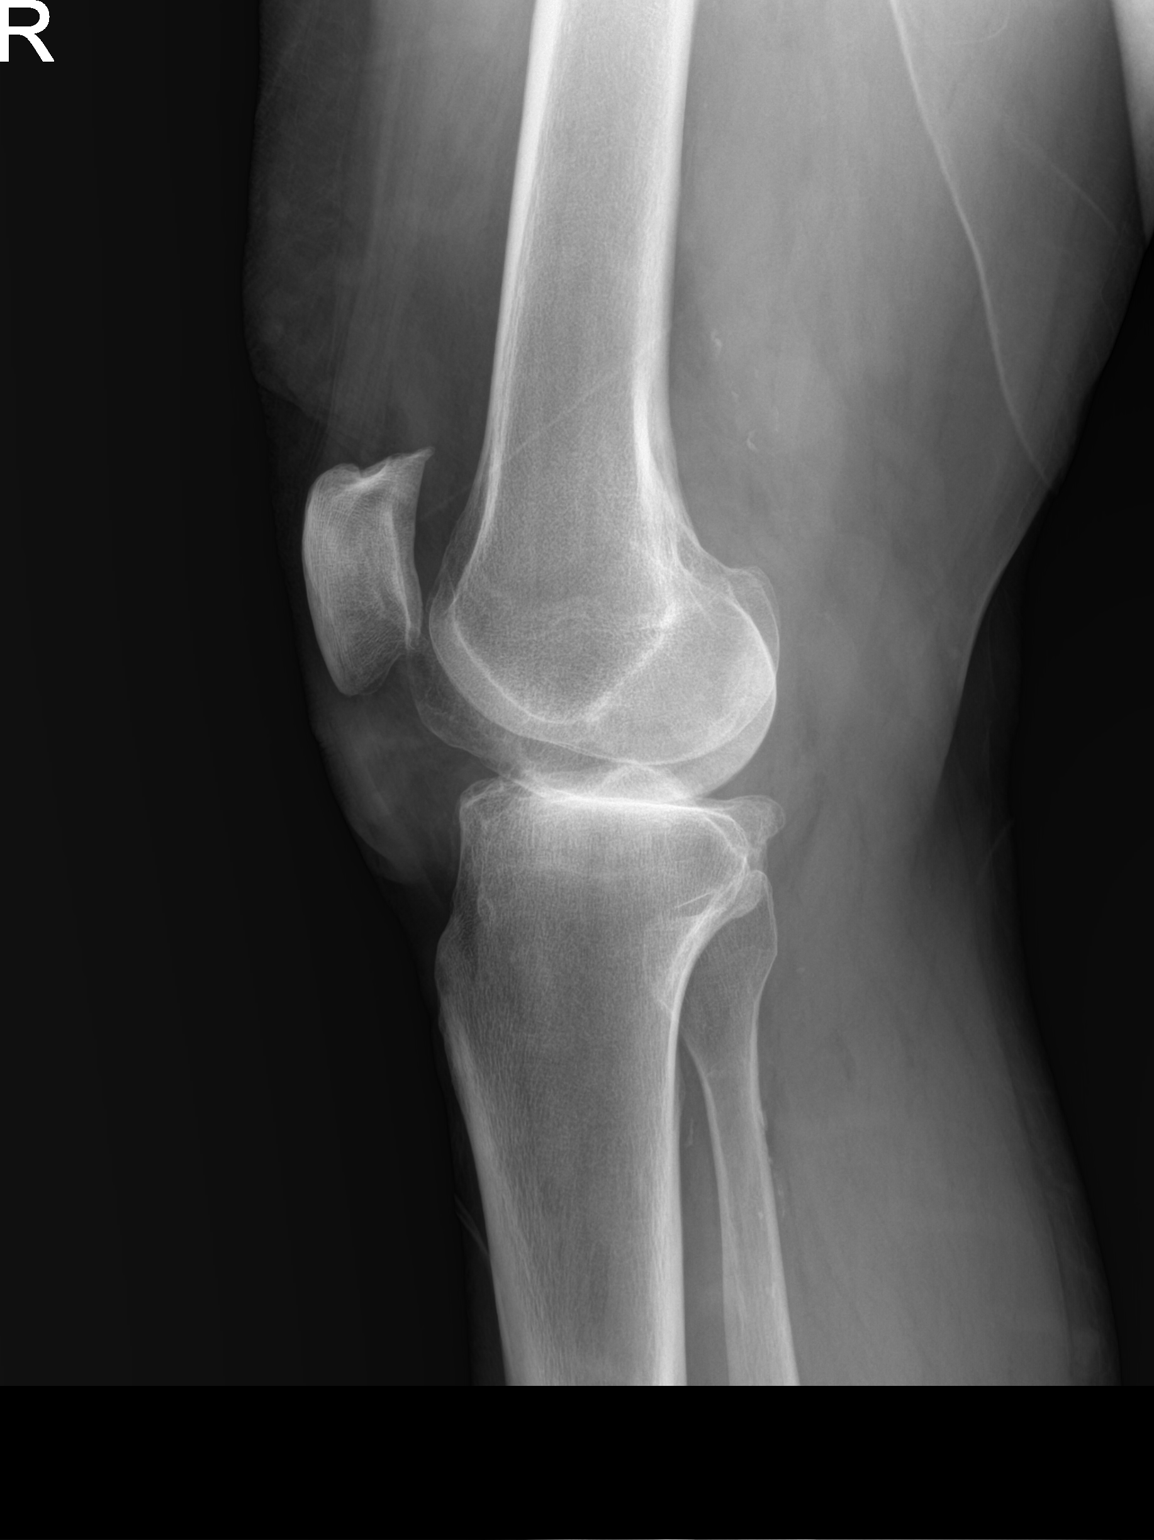

[2 of 2 positions shown; findings below may reference images not displayed]

FINDINGS: No fracture or dislocation is noted. Small suprapatellar joint
effusion is noted. Mild spurring of patella is noted. Severe
narrowing of medial joint space is noted.
IMPRESSION: Severe degenerative joint disease is noted medially. Small
suprapatellar joint effusion is noted. No fracture or dislocation is
noted.

## 2023-07-29 IMAGING — DX DG KNEE 1-2V*L*
2 series · 2 of 2 positions shown · non-contrast
Comparison: None.

CLINICAL DATA: Left knee pain.

EXAM:
LEFT KNEE - 1-2 VIEW

[knee ap]
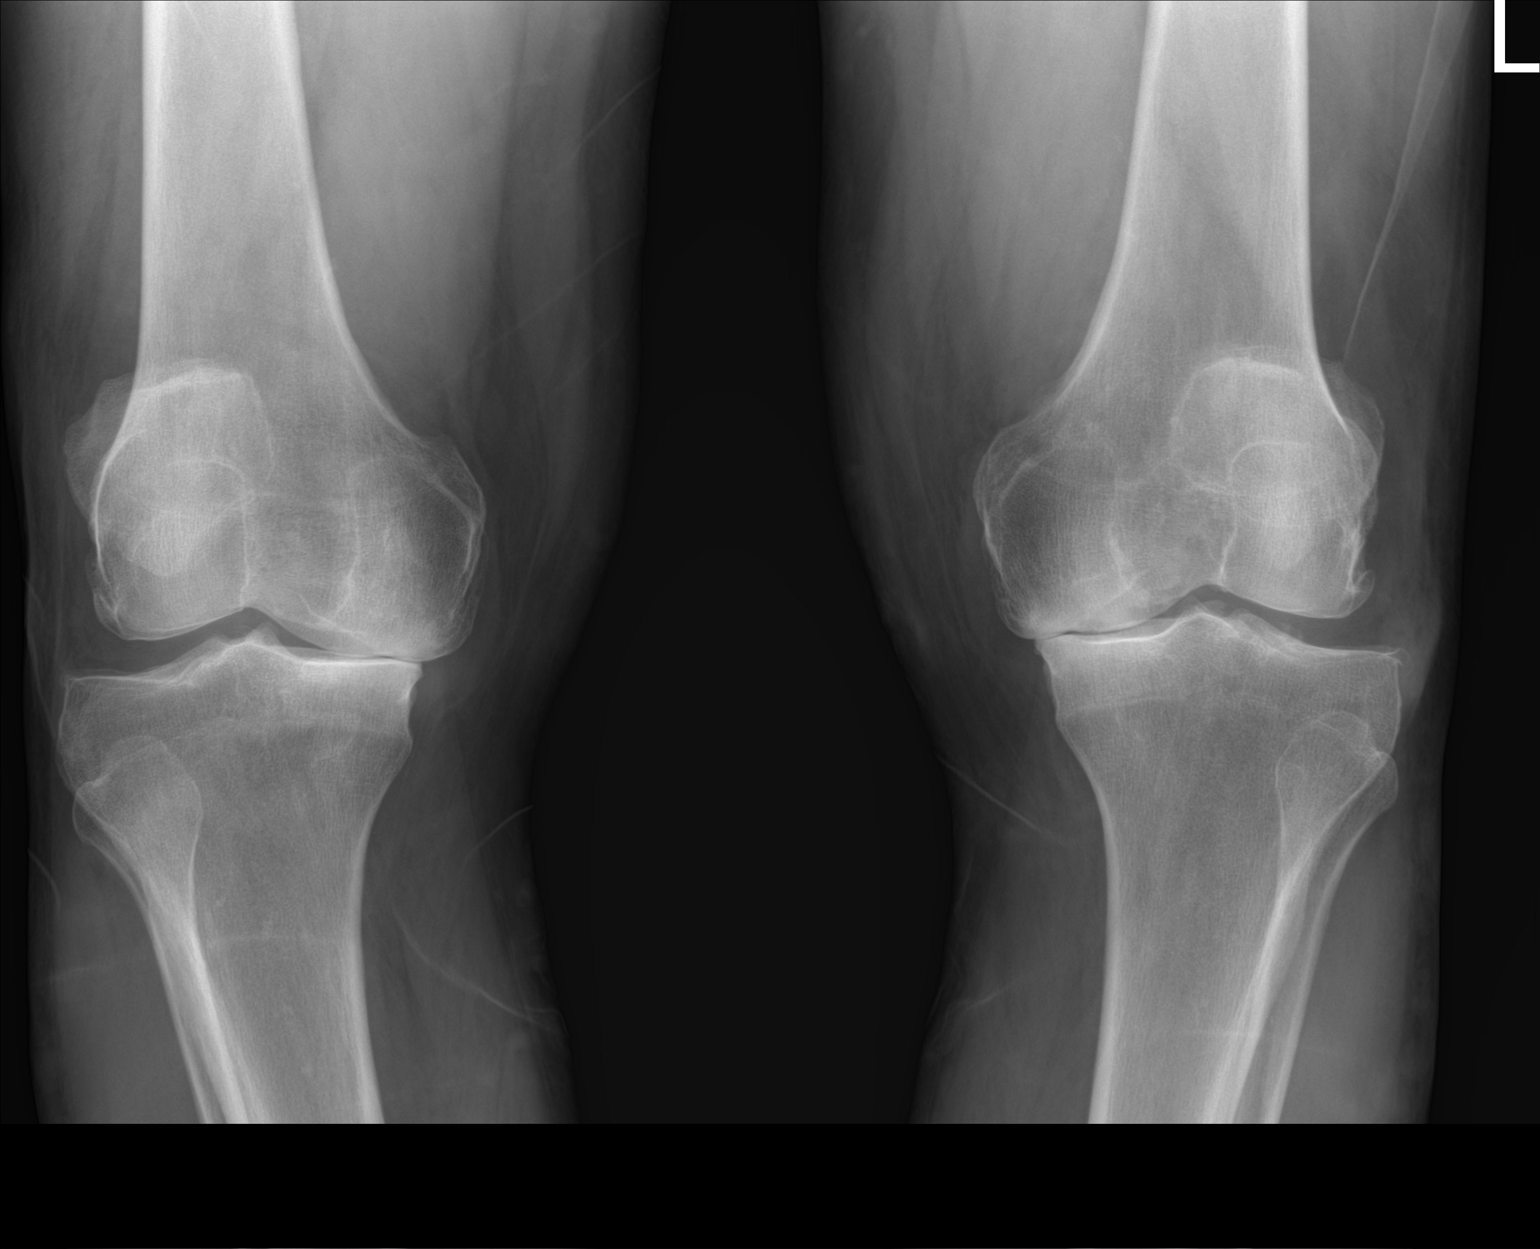

[knee lat]
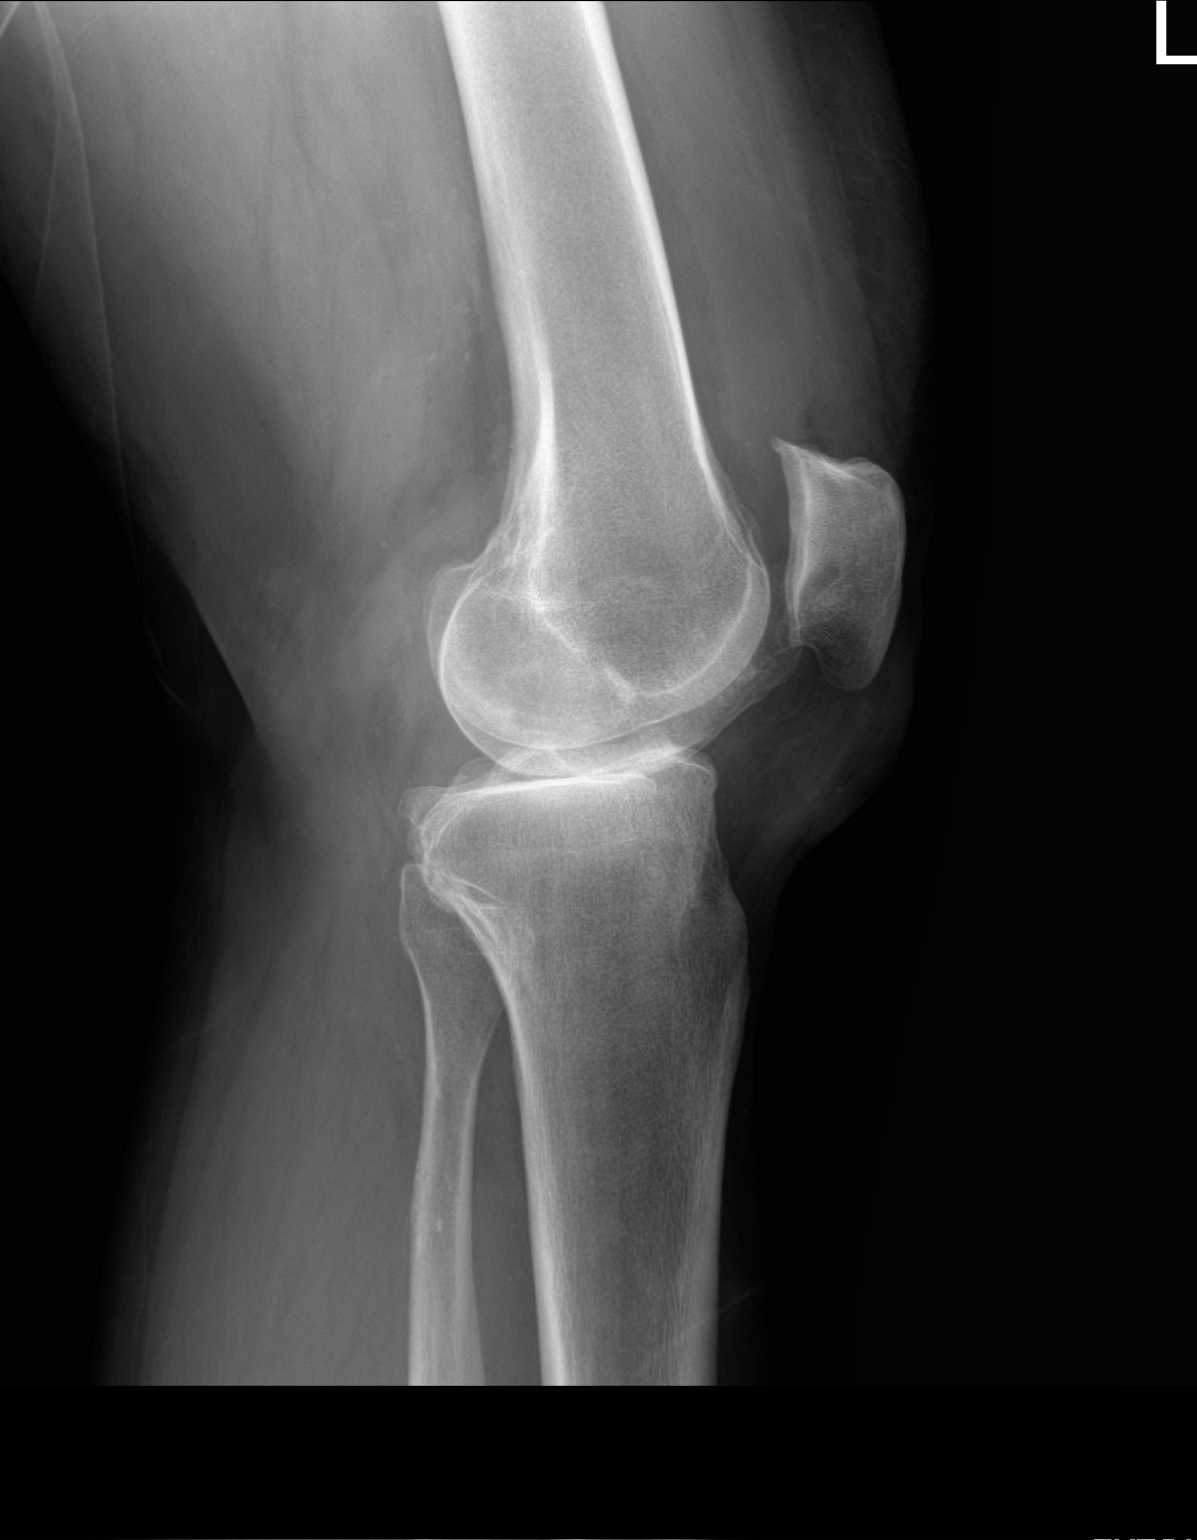

[2 of 2 positions shown; findings below may reference images not displayed]

FINDINGS: No fracture or dislocation is noted. Severe narrowing of medial
joint space is noted consistent with degenerative joint disease.
Mild osteophyte formation is noted laterally. Large suprapatellar
joint effusion is noted.
IMPRESSION: Severe degenerative joint disease is noted medially. Large
suprapatellar joint effusion is noted. No fracture or dislocation is
noted.

## 2024-03-11 ENCOUNTER — Other Ambulatory Visit: Payer: Self-pay

## 2024-03-11 DIAGNOSIS — Z1211 Encounter for screening for malignant neoplasm of colon: Secondary | ICD-10-CM

## 2024-04-12 DIAGNOSIS — Z1211 Encounter for screening for malignant neoplasm of colon: Secondary | ICD-10-CM | POA: Diagnosis not present

## 2024-04-16 LAB — COLOGUARD: COLOGUARD: POSITIVE — AB

## 2024-04-17 ENCOUNTER — Ambulatory Visit: Payer: Self-pay | Admitting: Family Medicine

## 2024-04-19 ENCOUNTER — Ambulatory Visit: Admitting: Family Medicine

## 2024-04-20 ENCOUNTER — Ambulatory Visit: Admitting: Family Medicine

## 2024-04-20 ENCOUNTER — Encounter: Payer: Self-pay | Admitting: Family Medicine

## 2024-04-20 VITALS — BP 144/91 | HR 72 | Temp 98.2°F | Ht 72.0 in | Wt 248.0 lb

## 2024-04-20 DIAGNOSIS — M25562 Pain in left knee: Secondary | ICD-10-CM | POA: Diagnosis not present

## 2024-04-20 DIAGNOSIS — Z125 Encounter for screening for malignant neoplasm of prostate: Secondary | ICD-10-CM

## 2024-04-20 DIAGNOSIS — G2581 Restless legs syndrome: Secondary | ICD-10-CM

## 2024-04-20 DIAGNOSIS — M7122 Synovial cyst of popliteal space [Baker], left knee: Secondary | ICD-10-CM

## 2024-04-20 DIAGNOSIS — G8929 Other chronic pain: Secondary | ICD-10-CM

## 2024-04-20 DIAGNOSIS — E782 Mixed hyperlipidemia: Secondary | ICD-10-CM

## 2024-04-20 DIAGNOSIS — M7121 Synovial cyst of popliteal space [Baker], right knee: Secondary | ICD-10-CM | POA: Diagnosis not present

## 2024-04-20 DIAGNOSIS — M792 Neuralgia and neuritis, unspecified: Secondary | ICD-10-CM | POA: Diagnosis not present

## 2024-04-20 DIAGNOSIS — M25561 Pain in right knee: Secondary | ICD-10-CM

## 2024-04-20 DIAGNOSIS — N529 Male erectile dysfunction, unspecified: Secondary | ICD-10-CM

## 2024-04-20 LAB — LIPID PANEL

## 2024-04-20 MED ORDER — SILDENAFIL CITRATE 20 MG PO TABS
ORAL_TABLET | ORAL | 3 refills | Status: AC
Start: 2024-04-20 — End: ?

## 2024-04-20 MED ORDER — DICLOFENAC SODIUM 75 MG PO TBEC
75.0000 mg | DELAYED_RELEASE_TABLET | Freq: Two times a day (BID) | ORAL | 3 refills | Status: DC
Start: 2024-04-20 — End: 2024-05-02

## 2024-04-20 MED ORDER — CARBIDOPA-LEVODOPA ER 50-200 MG PO TBCR
2.0000 | EXTENDED_RELEASE_TABLET | Freq: Two times a day (BID) | ORAL | 3 refills | Status: AC
Start: 2024-04-20 — End: ?

## 2024-04-20 MED ORDER — ATORVASTATIN CALCIUM 20 MG PO TABS
20.0000 mg | ORAL_TABLET | Freq: Every day | ORAL | 3 refills | Status: DC
Start: 2024-04-20 — End: 2024-05-02

## 2024-04-20 MED ORDER — PRAMIPEXOLE DIHYDROCHLORIDE 1.5 MG PO TABS: 1.5000 mg | ORAL_TABLET | Freq: Three times a day (TID) | ORAL | 3 refills | Status: DC

## 2024-04-20 NOTE — Progress Notes (Signed)
 Subjective:  Patient ID: Wesley Davenport, male    DOB: 07-16-1961  Age: 63 y.o. MRN: [id number]  CC: Medical Management of Chronic Issues (MEDICATIONS PENDED), Knee Pain (Believes needs knee surgery. Both are bothersome), and Numbness (Right arm and hand. Ongoing for a few months. Comes and goes.  )   HPI Wesley Davenport presents for multiple concerns about pain.  Of first concern is that he is having numbness tingling and pain in the 4th and 5th digits of his right hand it radiates all the way up the forearm to the elbow pretty much it is there all the time.  He also feels it in the posterior shoulder.  Denies weakness of the hand.  The patient had x-rays done here 2 years ago that show severe arthritis.  The left also had a large suprapatellar effusion.  Both appear to be candidates for knee replacement.  Hand pain ranges 3-4 up to 8-10/10 when it flares.   Knees average 2-3 on a good day. Walking 200 feet they go to 10/10.   Patient states he is very much dependent on his Mirapex  and his carbidopa -levodopa  for his restless leg.  It is fairly severe at night and it works great for him.        07/03/2023   10:36 AM 06/20/2022    1:25 PM 06/20/2022    1:19 PM  Depression screen PHQ 2/9  Decreased Interest 0 0 0  Down, Depressed, Hopeless 0 0 0  PHQ - 2 Score 0 0 0  Altered sleeping 3 3   Tired, decreased energy 0 1   Change in appetite 0 0   Feeling bad or failure about yourself  0 0   Trouble concentrating 0 0   Moving slowly or fidgety/restless 0 3   Suicidal thoughts 0 0   PHQ-9 Score 3 7   Difficult doing work/chores Somewhat difficult Extremely dIfficult     History Wesley Davenport has no past medical history on file.   He has a past surgical history that includes Hemorrhoid surgery.   His family history includes Cancer in his mother; Heart disease in his brother and father.He reports that he has been smoking. He has never used smokeless tobacco. He reports current alcohol use. He  reports that he does not use drugs.    ROS Review of Systems  Constitutional:  Negative for fever.  Respiratory:  Negative for shortness of breath.   Cardiovascular:  Negative for chest pain.  Musculoskeletal:  Positive for arthralgias.  Skin:  Negative for rash.    Objective:  BP (!) 144/91   Pulse 72   Temp 98.2 F (36.8 C)   Ht 6' (1.829 m)   Wt 248 lb (112.5 kg)   SpO2 92%   BMI 33.63 kg/m   BP Readings from Last 3 Encounters:  04/20/24 (!) 144/91  07/03/23 131/81  06/20/22 (!) 151/89    Wt Readings from Last 3 Encounters:  04/20/24 248 lb (112.5 kg)  07/03/23 232 lb (105.2 kg)  10/23/22 225 lb (102.1 kg)     Physical Exam Vitals reviewed.  Constitutional:      Appearance: He is well-developed.  HENT:     Head: Normocephalic and atraumatic.     Right Ear: External ear normal.     Left Ear: External ear normal.     Mouth/Throat:     Pharynx: No oropharyngeal exudate or posterior oropharyngeal erythema.   Eyes:     Pupils: Pupils are equal, round, and  reactive to light.    Cardiovascular:     Rate and Rhythm: Normal rate and regular rhythm.     Heart sounds: No murmur heard. Pulmonary:     Effort: No respiratory distress.     Breath sounds: Normal breath sounds.   Musculoskeletal:     Cervical back: Normal range of motion and neck supple.   Neurological:     Mental Status: He is alert and oriented to person, place, and time.      Assessment & Plan:  Neuralgia -     Ambulatory referral to Hand Surgery -     CBC with Differential/Platelet -     CMP14+EGFR  Mixed hyperlipidemia -     Atorvastatin  Calcium ; Take 1 tablet (20 mg total) by mouth daily. For cholesterol  Dispense: 90 tablet; Refill: 3 -     CBC with Differential/Platelet -     CMP14+EGFR -     Lipid panel  Restless leg syndrome, controlled -     Pramipexole  Dihydrochloride; Take 1 tablet (1.5 mg total) by mouth 3 (three) times daily.  Dispense: 270 tablet; Refill: 3 -      Carbidopa -Levodopa  ER; Take 2 tablets by mouth 2 (two) times daily.  Dispense: 360 tablet; Refill: 3 -     CBC with Differential/Platelet -     CMP14+EGFR  Synovial cyst of left popliteal space -     Diclofenac  Sodium; Take 1 tablet (75 mg total) by mouth 2 (two) times daily. For muscle and  Joint pain  Dispense: 180 tablet; Refill: 3 -     Ambulatory referral to Orthopedics -     CBC with Differential/Platelet -     CMP14+EGFR  Baker's cyst of knee, right -     Diclofenac  Sodium; Take 1 tablet (75 mg total) by mouth 2 (two) times daily. For muscle and  Joint pain  Dispense: 180 tablet; Refill: 3 -     Ambulatory referral to Orthopedics -     CBC with Differential/Platelet -     CMP14+EGFR  Vasculogenic erectile dysfunction, unspecified vasculogenic erectile dysfunction type -     Sildenafil  Citrate; Take 2-5 pills at once, orally, with each sexual encounter  Dispense: 100 tablet; Refill: 3 -     CBC with Differential/Platelet -     CMP14+EGFR  Chronic pain of both knees -     Ambulatory referral to Orthopedics -     CBC with Differential/Platelet -     CMP14+EGFR  Screening for prostate cancer -     PSA, total and free     Follow-up: No follow-ups on file.  Roise Cleaver, M.D.

## 2024-04-21 LAB — CMP14+EGFR
ALT: 38 IU/L (ref 0–44)
AST: 26 IU/L (ref 0–40)
Albumin: 4.3 g/dL (ref 3.9–4.9)
Alkaline Phosphatase: 91 IU/L (ref 44–121)
BUN/Creatinine Ratio: 10 (ref 10–24)
BUN: 11 mg/dL (ref 8–27)
Bilirubin Total: 0.3 mg/dL (ref 0.0–1.2)
CO2: 23 mmol/L (ref 20–29)
Calcium: 9.5 mg/dL (ref 8.6–10.2)
Chloride: 100 mmol/L (ref 96–106)
Creatinine, Ser: 1.05 mg/dL (ref 0.76–1.27)
Globulin, Total: 2.8 g/dL (ref 1.5–4.5)
Glucose: 95 mg/dL (ref 70–99)
Potassium: 4.6 mmol/L (ref 3.5–5.2)
Sodium: 138 mmol/L (ref 134–144)
Total Protein: 7.1 g/dL (ref 6.0–8.5)
eGFR: 79 mL/min/{1.73_m2} (ref >59)

## 2024-04-21 LAB — CBC WITH DIFFERENTIAL/PLATELET
Basophils Absolute: 0 10*3/uL (ref 0.0–0.2)
Basos: 0 %
EOS (ABSOLUTE): 0.1 10*3/uL (ref 0.0–0.4)
Eos: 1 %
Hematocrit: 50.9 % (ref 37.5–51.0)
Hemoglobin: 16.6 g/dL (ref 13.0–17.7)
Immature Grans (Abs): 0.2 10*3/uL — ABNORMAL HIGH (ref 0.0–0.1)
Immature Granulocytes: 2 %
Lymphocytes Absolute: 2.4 10*3/uL (ref 0.7–3.1)
Lymphs: 22 %
MCH: 30.3 pg (ref 26.6–33.0)
MCHC: 32.6 g/dL (ref 31.5–35.7)
MCV: 93 fL (ref 79–97)
Monocytes Absolute: 0.9 10*3/uL (ref 0.1–0.9)
Monocytes: 8 %
Neutrophils Absolute: 7.4 10*3/uL — ABNORMAL HIGH (ref 1.4–7.0)
Neutrophils: 67 %
Platelets: 240 10*3/uL (ref 150–450)
RBC: 5.48 x10E6/uL (ref 4.14–5.80)
RDW: 13.3 % (ref 11.6–15.4)
WBC: 11 10*3/uL — ABNORMAL HIGH (ref 3.4–10.8)

## 2024-04-21 LAB — LIPID PANEL
Cholesterol, Total: 235 mg/dL — ABNORMAL HIGH (ref 100–199)
HDL: 55 mg/dL (ref >39)
LDL CALC COMMENT:: 4.3 ratio (ref 0.0–5.0)
LDL Chol Calc (NIH): 166 mg/dL — ABNORMAL HIGH (ref 0–99)
Triglycerides: 80 mg/dL (ref 0–149)
VLDL Cholesterol Cal: 14 mg/dL (ref 5–40)

## 2024-04-21 LAB — PSA, TOTAL AND FREE
PSA, Free Pct: 30 %
PSA, Free: 0.09 ng/mL
Prostate Specific Ag, Serum: 0.3 ng/mL (ref 0.0–4.0)

## 2024-04-23 ENCOUNTER — Encounter: Payer: Self-pay | Admitting: Family Medicine

## 2024-04-26 ENCOUNTER — Ambulatory Visit: Payer: Self-pay | Admitting: Family Medicine

## 2024-05-01 ENCOUNTER — Inpatient Hospital Stay (HOSPITAL_COMMUNITY)

## 2024-05-01 ENCOUNTER — Other Ambulatory Visit: Payer: Self-pay

## 2024-05-01 ENCOUNTER — Other Ambulatory Visit (HOSPITAL_COMMUNITY): Payer: Self-pay

## 2024-05-01 ENCOUNTER — Inpatient Hospital Stay (HOSPITAL_COMMUNITY)
Admission: EM | Admit: 2024-05-01 | Discharge: 2024-05-02 | DRG: 322 | Disposition: A | Discharge status: Home / Self Care | Attending: Cardiology | Admitting: Cardiology

## 2024-05-01 ENCOUNTER — Encounter (HOSPITAL_COMMUNITY)
Admission: EM | Disposition: A | Discharge status: Home / Self Care | Payer: Self-pay | Source: Home / Self Care | Attending: Cardiology

## 2024-05-01 ENCOUNTER — Emergency Department (HOSPITAL_COMMUNITY)

## 2024-05-01 ENCOUNTER — Encounter (HOSPITAL_COMMUNITY): Payer: Self-pay | Admitting: Emergency Medicine

## 2024-05-01 DIAGNOSIS — I2119 ST elevation (STEMI) myocardial infarction involving other coronary artery of inferior wall: Secondary | ICD-10-CM | POA: Diagnosis present

## 2024-05-01 DIAGNOSIS — I10 Essential (primary) hypertension: Secondary | ICD-10-CM

## 2024-05-01 DIAGNOSIS — Z6833 Body mass index (BMI) 33.0-33.9, adult: Secondary | ICD-10-CM | POA: Diagnosis not present

## 2024-05-01 DIAGNOSIS — I4891 Unspecified atrial fibrillation: Secondary | ICD-10-CM

## 2024-05-01 DIAGNOSIS — R61 Generalized hyperhidrosis: Secondary | ICD-10-CM | POA: Diagnosis not present

## 2024-05-01 DIAGNOSIS — G4730 Sleep apnea, unspecified: Secondary | ICD-10-CM | POA: Diagnosis present

## 2024-05-01 DIAGNOSIS — Z79899 Other long term (current) drug therapy: Secondary | ICD-10-CM

## 2024-05-01 DIAGNOSIS — E6609 Other obesity due to excess calories: Secondary | ICD-10-CM | POA: Diagnosis not present

## 2024-05-01 DIAGNOSIS — R6889 Other general symptoms and signs: Secondary | ICD-10-CM | POA: Diagnosis not present

## 2024-05-01 DIAGNOSIS — I2111 ST elevation (STEMI) myocardial infarction involving right coronary artery: Secondary | ICD-10-CM | POA: Diagnosis present

## 2024-05-01 DIAGNOSIS — Z8249 Family history of ischemic heart disease and other diseases of the circulatory system: Secondary | ICD-10-CM

## 2024-05-01 DIAGNOSIS — R079 Chest pain, unspecified: Secondary | ICD-10-CM | POA: Diagnosis not present

## 2024-05-01 DIAGNOSIS — I480 Paroxysmal atrial fibrillation: Secondary | ICD-10-CM

## 2024-05-01 DIAGNOSIS — Z955 Presence of coronary angioplasty implant and graft: Secondary | ICD-10-CM

## 2024-05-01 DIAGNOSIS — R0683 Snoring: Secondary | ICD-10-CM

## 2024-05-01 DIAGNOSIS — I213 ST elevation (STEMI) myocardial infarction of unspecified site: Principal | ICD-10-CM

## 2024-05-01 DIAGNOSIS — Z7902 Long term (current) use of antithrombotics/antiplatelets: Secondary | ICD-10-CM

## 2024-05-01 DIAGNOSIS — Z87891 Personal history of nicotine dependence: Secondary | ICD-10-CM

## 2024-05-01 DIAGNOSIS — I499 Cardiac arrhythmia, unspecified: Secondary | ICD-10-CM | POA: Diagnosis not present

## 2024-05-01 DIAGNOSIS — E66811 Obesity, class 1: Secondary | ICD-10-CM

## 2024-05-01 DIAGNOSIS — Z7901 Long term (current) use of anticoagulants: Secondary | ICD-10-CM | POA: Diagnosis not present

## 2024-05-01 DIAGNOSIS — E669 Obesity, unspecified: Secondary | ICD-10-CM | POA: Diagnosis present

## 2024-05-01 DIAGNOSIS — E785 Hyperlipidemia, unspecified: Secondary | ICD-10-CM | POA: Diagnosis present

## 2024-05-01 DIAGNOSIS — R7303 Prediabetes: Secondary | ICD-10-CM | POA: Diagnosis present

## 2024-05-01 DIAGNOSIS — R195 Other fecal abnormalities: Secondary | ICD-10-CM | POA: Diagnosis present

## 2024-05-01 DIAGNOSIS — G2581 Restless legs syndrome: Secondary | ICD-10-CM | POA: Diagnosis present

## 2024-05-01 DIAGNOSIS — I2510 Atherosclerotic heart disease of native coronary artery without angina pectoris: Secondary | ICD-10-CM

## 2024-05-01 DIAGNOSIS — E782 Mixed hyperlipidemia: Secondary | ICD-10-CM

## 2024-05-01 HISTORY — DX: Hyperlipidemia, unspecified: E78.5

## 2024-05-01 HISTORY — PX: CORONARY/GRAFT ACUTE MI REVASCULARIZATION: CATH118305

## 2024-05-01 HISTORY — PX: LEFT HEART CATH AND CORONARY ANGIOGRAPHY: CATH118249

## 2024-05-01 HISTORY — PX: CARDIOVERSION: EP1203

## 2024-05-01 HISTORY — DX: Restless legs syndrome: G25.81

## 2024-05-01 LAB — CBC WITH DIFFERENTIAL/PLATELET
Abs Immature Granulocytes: 0.06 10*3/uL (ref 0.00–0.07)
Basophils Absolute: 0 10*3/uL (ref 0.0–0.1)
Basophils Relative: 0 %
Eosinophils Absolute: 0.1 10*3/uL (ref 0.0–0.5)
Eosinophils Relative: 1 %
HCT: 46.9 % (ref 39.0–52.0)
Hemoglobin: 15.6 g/dL (ref 13.0–17.0)
Immature Granulocytes: 1 %
Lymphocytes Relative: 21 %
Lymphs Abs: 2.1 10*3/uL (ref 0.7–4.0)
MCH: 31 pg (ref 26.0–34.0)
MCHC: 33.3 g/dL (ref 30.0–36.0)
MCV: 93.2 fL (ref 80.0–100.0)
Monocytes Absolute: 0.6 10*3/uL (ref 0.1–1.0)
Monocytes Relative: 6 %
Neutro Abs: 7.2 10*3/uL (ref 1.7–7.7)
Neutrophils Relative %: 71 %
Platelets: 213 10*3/uL (ref 150–400)
RBC: 5.03 MIL/uL (ref 4.22–5.81)
RDW: 14.3 % (ref 11.5–15.5)
WBC: 10.1 10*3/uL (ref 4.0–10.5)
nRBC: 0 % (ref 0.0–0.2)

## 2024-05-01 LAB — BASIC METABOLIC PANEL WITH GFR
Anion gap: 10 (ref 5–15)
BUN: 7 mg/dL — ABNORMAL LOW (ref 8–23)
CO2: 25 mmol/L (ref 22–32)
Calcium: 9 mg/dL (ref 8.9–10.3)
Chloride: 103 mmol/L (ref 98–111)
Creatinine, Ser: 1.05 mg/dL (ref 0.61–1.24)
GFR, Estimated: >60 mL/min (ref >60)
Glucose, Bld: 127 mg/dL — ABNORMAL HIGH (ref 70–99)
Potassium: 4.3 mmol/L (ref 3.5–5.1)
Sodium: 138 mmol/L (ref 135–145)

## 2024-05-01 LAB — CBC
HCT: 47.9 % (ref 39.0–52.0)
Hemoglobin: 15.6 g/dL (ref 13.0–17.0)
MCH: 30.1 pg (ref 26.0–34.0)
MCHC: 32.6 g/dL (ref 30.0–36.0)
MCV: 92.5 fL (ref 80.0–100.0)
Platelets: 219 10*3/uL (ref 150–400)
RBC: 5.18 MIL/uL (ref 4.22–5.81)
RDW: 14.3 % (ref 11.5–15.5)
WBC: 11.5 10*3/uL — ABNORMAL HIGH (ref 4.0–10.5)
nRBC: 0 % (ref 0.0–0.2)

## 2024-05-01 LAB — COMPREHENSIVE METABOLIC PANEL WITH GFR
ALT: 36 U/L (ref 0–44)
AST: 28 U/L (ref 15–41)
Albumin: 3.7 g/dL (ref 3.5–5.0)
Alkaline Phosphatase: 69 U/L (ref 38–126)
Anion gap: 10 (ref 5–15)
BUN: 10 mg/dL (ref 8–23)
CO2: 26 mmol/L (ref 22–32)
Calcium: 9 mg/dL (ref 8.9–10.3)
Chloride: 98 mmol/L (ref 98–111)
Creatinine, Ser: 1.14 mg/dL (ref 0.61–1.24)
GFR, Estimated: >60 mL/min (ref >60)
Glucose, Bld: 152 mg/dL — ABNORMAL HIGH (ref 70–99)
Potassium: 3.7 mmol/L (ref 3.5–5.1)
Sodium: 134 mmol/L — ABNORMAL LOW (ref 135–145)
Total Bilirubin: 0.5 mg/dL (ref 0.0–1.2)
Total Protein: 7 g/dL (ref 6.5–8.1)

## 2024-05-01 LAB — LIPID PANEL
Cholesterol: 208 mg/dL — ABNORMAL HIGH (ref 0–200)
HDL: 45 mg/dL (ref >40)
LDL Cholesterol: 150 mg/dL — ABNORMAL HIGH (ref 0–99)
Total CHOL/HDL Ratio: 4.6 ratio
Triglycerides: 65 mg/dL (ref <150)
VLDL: 13 mg/dL (ref 0–40)

## 2024-05-01 LAB — PROTIME-INR
INR: 1 (ref 0.8–1.2)
Prothrombin Time: 13.1 s (ref 11.4–15.2)

## 2024-05-01 LAB — TROPONIN I (HIGH SENSITIVITY)
Troponin I (High Sensitivity): 74 ng/L — ABNORMAL HIGH (ref <18)
Troponin I (High Sensitivity): 3064 ng/L (ref <18)

## 2024-05-01 LAB — LACTIC ACID, PLASMA
Lactic Acid, Venous: 2.1 mmol/L (ref 0.5–1.9)
Lactic Acid, Venous: 1.6 mmol/L (ref 0.5–1.9)

## 2024-05-01 LAB — MRSA NEXT GEN BY PCR, NASAL: MRSA by PCR Next Gen: NOT DETECTED

## 2024-05-01 LAB — POCT ACTIVATED CLOTTING TIME: Activated Clotting Time: 245 s

## 2024-05-01 LAB — APTT: aPTT: 25 s (ref 24–36)

## 2024-05-01 SURGERY — CORONARY/GRAFT ACUTE MI REVASCULARIZATION
Anesthesia: LOCAL

## 2024-05-01 MED ORDER — SODIUM CHLORIDE 0.9% FLUSH
3.0000 mL | Freq: Two times a day (BID) | INTRAVENOUS | Status: DC
  Administered 2024-05-01 – 2024-05-02 (×3): 3 mL via INTRAVENOUS

## 2024-05-01 MED ORDER — ORAL CARE MOUTH RINSE
15.0000 mL | OROMUCOSAL | Status: DC | PRN
Start: 2024-05-01 — End: 2024-05-02

## 2024-05-01 MED ORDER — ASPIRIN 81 MG PO CHEW: 324.0000 mg | CHEWABLE_TABLET | Freq: Once | ORAL | Status: AC

## 2024-05-01 MED ORDER — PRASUGREL HCL 10 MG PO TABS
ORAL_TABLET | ORAL | Status: AC
  Filled 2024-05-01: qty 1

## 2024-05-01 MED ORDER — VERAPAMIL HCL 2.5 MG/ML IV SOLN
INTRAVENOUS | Status: DC | PRN
  Administered 2024-05-01: 10 mL via INTRA_ARTERIAL

## 2024-05-01 MED ORDER — METOPROLOL SUCCINATE ER 25 MG PO TB24
25.0000 mg | ORAL_TABLET | Freq: Every day | ORAL | Status: DC
  Administered 2024-05-02: 25 mg via ORAL
  Filled 2024-05-01: qty 1

## 2024-05-01 MED ORDER — APIXABAN 5 MG PO TABS
5.0000 mg | ORAL_TABLET | Freq: Two times a day (BID) | ORAL | Status: DC
  Administered 2024-05-01 – 2024-05-02 (×3): 5 mg via ORAL
  Filled 2024-05-01 (×3): qty 1

## 2024-05-01 MED ORDER — HEPARIN SODIUM (PORCINE) 1000 UNIT/ML IJ SOLN
INTRAMUSCULAR | Status: DC | PRN
  Administered 2024-05-01: 8000 [IU] via INTRAVENOUS

## 2024-05-01 MED ORDER — CHLORHEXIDINE GLUCONATE CLOTH 2 % EX PADS
6.0000 | MEDICATED_PAD | Freq: Every day | CUTANEOUS | Status: DC
  Administered 2024-05-01 – 2024-05-02 (×2): 6 via TOPICAL

## 2024-05-01 MED ORDER — PRASUGREL HCL 10 MG PO TABS
ORAL_TABLET | ORAL | Status: DC | PRN
  Administered 2024-05-01: 60 mg via ORAL

## 2024-05-01 MED ORDER — ASPIRIN 81 MG PO CHEW
81.0000 mg | CHEWABLE_TABLET | Freq: Every day | ORAL | Status: DC
  Administered 2024-05-02: 81 mg via ORAL
  Filled 2024-05-01: qty 1

## 2024-05-01 MED ORDER — KETAMINE HCL 50 MG/5ML IJ SOSY
100.0000 mg | PREFILLED_SYRINGE | Freq: Once | INTRAMUSCULAR | Status: AC
  Administered 2024-05-01: 100 mg via INTRAVENOUS

## 2024-05-01 MED ORDER — SODIUM CHLORIDE 0.9 % IV SOLN: INTRAVENOUS | Status: AC

## 2024-05-01 MED ORDER — MIDAZOLAM HCL 2 MG/2ML IJ SOLN: 1.0000 mg | Freq: Once | INTRAMUSCULAR | Status: AC

## 2024-05-01 MED ORDER — IOHEXOL 350 MG/ML SOLN
INTRAVENOUS | Status: DC | PRN
Start: 2024-05-01 — End: 2024-05-01
  Administered 2024-05-01: 125 mL

## 2024-05-01 MED ORDER — AMIODARONE HCL IN DEXTROSE 360-4.14 MG/200ML-% IV SOLN
60.0000 mg/h | INTRAVENOUS | Status: AC
  Administered 2024-05-01: 60 mg/h via INTRAVENOUS
  Filled 2024-05-01: qty 200

## 2024-05-01 MED ORDER — ACETAMINOPHEN 325 MG PO TABS
650.0000 mg | ORAL_TABLET | ORAL | Status: DC | PRN
Start: 2024-05-01 — End: 2024-05-02

## 2024-05-01 MED ORDER — MIDAZOLAM HCL 2 MG/2ML IJ SOLN
INTRAMUSCULAR | Status: AC
  Administered 2024-05-01: 1 mg via INTRAVENOUS
  Filled 2024-05-01: qty 2

## 2024-05-01 MED ORDER — LIDOCAINE HCL (PF) 1 % IJ SOLN
INTRAMUSCULAR | Status: AC
  Filled 2024-05-01: qty 30

## 2024-05-01 MED ORDER — CLOPIDOGREL BISULFATE 75 MG PO TABS: 75.0000 mg | ORAL_TABLET | Freq: Every day | ORAL | Status: DC

## 2024-05-01 MED ORDER — FLUTICASONE PROPIONATE 50 MCG/ACT NA SUSP
1.0000 | Freq: Every day | NASAL | Status: DC
  Administered 2024-05-01 – 2024-05-02 (×2): 1 via NASAL
  Filled 2024-05-01: qty 16

## 2024-05-01 MED ORDER — SODIUM CHLORIDE 0.9 % IV SOLN: 250.0000 mL | INTRAVENOUS | Status: AC | PRN

## 2024-05-01 MED ORDER — PANTOPRAZOLE SODIUM 40 MG PO TBEC
40.0000 mg | DELAYED_RELEASE_TABLET | Freq: Every day | ORAL | Status: DC
  Administered 2024-05-01 – 2024-05-02 (×2): 40 mg via ORAL
  Filled 2024-05-01 (×2): qty 1

## 2024-05-01 MED ORDER — MIDAZOLAM HCL 2 MG/2ML IJ SOLN
INTRAMUSCULAR | Status: AC
  Filled 2024-05-01: qty 2

## 2024-05-01 MED ORDER — CLOPIDOGREL BISULFATE 300 MG PO TABS
600.0000 mg | ORAL_TABLET | Freq: Once | ORAL | Status: AC
  Administered 2024-05-02: 600 mg via ORAL
  Filled 2024-05-01: qty 2

## 2024-05-01 MED ORDER — HEPARIN SODIUM (PORCINE) 5000 UNIT/ML IJ SOLN
4000.0000 [IU] | Freq: Once | INTRAMUSCULAR | Status: AC
  Administered 2024-05-01: 4000 [IU] via INTRAVENOUS
  Filled 2024-05-01: qty 1

## 2024-05-01 MED ORDER — AMIODARONE LOAD VIA INFUSION
INTRAVENOUS | Status: DC | PRN
  Administered 2024-05-01: 150 mg via INTRAVENOUS

## 2024-05-01 MED ORDER — MIDAZOLAM HCL 2 MG/2ML IJ SOLN
2.0000 mg | Freq: Once | INTRAMUSCULAR | Status: AC
  Administered 2024-05-01: 2 mg via INTRAVENOUS
  Filled 2024-05-01: qty 2

## 2024-05-01 MED ORDER — SODIUM CHLORIDE 0.9% FLUSH: 3.0000 mL | INTRAVENOUS | Status: DC | PRN

## 2024-05-01 MED ORDER — AMIODARONE HCL IN DEXTROSE 360-4.14 MG/200ML-% IV SOLN
INTRAVENOUS | Status: AC | PRN
  Administered 2024-05-01: 60 mg/h via INTRAVENOUS

## 2024-05-01 MED ORDER — AMIODARONE HCL IN DEXTROSE 360-4.14 MG/200ML-% IV SOLN: 30.0000 mg/h | INTRAVENOUS | Status: DC

## 2024-05-01 MED ORDER — MORPHINE SULFATE (PF) 4 MG/ML IV SOLN: 4.0000 mg | Freq: Once | INTRAVENOUS | Status: AC

## 2024-05-01 MED ORDER — ONDANSETRON HCL 4 MG/2ML IJ SOLN
4.0000 mg | Freq: Four times a day (QID) | INTRAMUSCULAR | Status: DC | PRN
Start: 2024-05-01 — End: 2024-05-02

## 2024-05-01 MED ORDER — HYDRALAZINE HCL 20 MG/ML IJ SOLN: 5.0000 mg | INTRAMUSCULAR | Status: DC | PRN

## 2024-05-01 MED ORDER — NITROGLYCERIN 0.4 MG SL SUBL
SUBLINGUAL_TABLET | SUBLINGUAL | Status: AC
  Administered 2024-05-01: 0.4 mg
  Filled 2024-05-01: qty 1

## 2024-05-01 MED ORDER — VERAPAMIL HCL 2.5 MG/ML IV SOLN
INTRAVENOUS | Status: AC
  Filled 2024-05-01: qty 2

## 2024-05-01 MED ORDER — PRAMIPEXOLE DIHYDROCHLORIDE 1.5 MG PO TABS
1.5000 mg | ORAL_TABLET | Freq: Three times a day (TID) | ORAL | Status: DC
  Administered 2024-05-01 (×2): 1.5 mg via ORAL
  Filled 2024-05-01 (×6): qty 1

## 2024-05-01 MED ORDER — AMIODARONE HCL 150 MG/3ML IV SOLN
INTRAVENOUS | Status: AC
  Filled 2024-05-01: qty 3

## 2024-05-01 MED ORDER — HEPARIN SODIUM (PORCINE) 1000 UNIT/ML IJ SOLN
INTRAMUSCULAR | Status: DC | PRN
Start: 2024-05-01 — End: 2024-05-01
  Administered 2024-05-01: 2000 [IU] via INTRAVENOUS

## 2024-05-01 MED ORDER — LIDOCAINE HCL (PF) 1 % IJ SOLN
INTRAMUSCULAR | Status: DC | PRN
  Administered 2024-05-01: 2 mL via INTRADERMAL

## 2024-05-01 MED ORDER — AMIODARONE HCL IN DEXTROSE 360-4.14 MG/200ML-% IV SOLN
INTRAVENOUS | Status: AC
  Filled 2024-05-01: qty 200

## 2024-05-01 MED ORDER — MIDAZOLAM HCL 2 MG/2ML IJ SOLN
INTRAMUSCULAR | Status: DC | PRN
  Administered 2024-05-01 (×2): 2 mg via INTRAVENOUS

## 2024-05-01 MED ORDER — PRASUGREL HCL 10 MG PO TABS
10.0000 mg | ORAL_TABLET | Freq: Every day | ORAL | Status: DC
Start: 2024-05-02 — End: 2024-05-01

## 2024-05-01 MED ORDER — HEPARIN SODIUM (PORCINE) 1000 UNIT/ML IJ SOLN
INTRAMUSCULAR | Status: AC
  Filled 2024-05-01: qty 10

## 2024-05-01 MED ORDER — MORPHINE SULFATE (PF) 4 MG/ML IV SOLN
INTRAVENOUS | Status: AC
  Administered 2024-05-01: 4 mg via INTRAVENOUS
  Filled 2024-05-01: qty 1

## 2024-05-01 MED ORDER — HEPARIN (PORCINE) IN NACL 1000-0.9 UT/500ML-% IV SOLN
INTRAVENOUS | Status: DC | PRN
  Administered 2024-05-01 (×2): 500 mL

## 2024-05-01 MED ORDER — METOPROLOL TARTRATE 25 MG PO TABS
25.0000 mg | ORAL_TABLET | Freq: Three times a day (TID) | ORAL | Status: DC
  Administered 2024-05-01: 25 mg via ORAL
  Filled 2024-05-01: qty 1

## 2024-05-01 MED ORDER — CARBIDOPA-LEVODOPA ER 50-200 MG PO TBCR
1.0000 | EXTENDED_RELEASE_TABLET | Freq: Three times a day (TID) | ORAL | Status: DC
  Administered 2024-05-01 (×4): 1 via ORAL
  Filled 2024-05-01 (×8): qty 1

## 2024-05-01 MED ORDER — FENTANYL CITRATE (PF) 100 MCG/2ML IJ SOLN
INTRAMUSCULAR | Status: AC
  Filled 2024-05-01: qty 2

## 2024-05-01 MED ORDER — KETAMINE HCL 50 MG/5ML IJ SOSY
1.0000 mg/kg | PREFILLED_SYRINGE | Freq: Once | INTRAMUSCULAR | Status: DC
  Filled 2024-05-01: qty 15

## 2024-05-01 MED ORDER — FENTANYL CITRATE (PF) 100 MCG/2ML IJ SOLN
INTRAMUSCULAR | Status: DC | PRN
  Administered 2024-05-01: 25 ug via INTRAVENOUS
  Administered 2024-05-01: 50 ug via INTRAVENOUS

## 2024-05-01 MED ORDER — SODIUM CHLORIDE 0.9 % IV SOLN
INTRAVENOUS | Status: AC | PRN
  Administered 2024-05-01: 100 mL/h via INTRAVENOUS

## 2024-05-01 MED ORDER — ATORVASTATIN CALCIUM 80 MG PO TABS
80.0000 mg | ORAL_TABLET | Freq: Every day | ORAL | Status: DC
  Administered 2024-05-01 – 2024-05-02 (×2): 80 mg via ORAL
  Filled 2024-05-01 (×2): qty 1

## 2024-05-01 MED ORDER — ASPIRIN 81 MG PO CHEW
CHEWABLE_TABLET | ORAL | Status: AC
  Administered 2024-05-01: 324 mg via ORAL
  Filled 2024-05-01: qty 4

## 2024-05-01 SURGICAL SUPPLY — 14 items
BALLOON EMERGE MR 3.0X12 (BALLOONS) IMPLANT
CATH INFINITI AMBI 5FR JK (CATHETERS) IMPLANT
CATH INFINITI JR4 5F (CATHETERS) IMPLANT
CATH LAUNCHER 6FR JR4 (CATHETERS) IMPLANT
DEVICE RAD COMP TR BAND LRG (VASCULAR PRODUCTS) IMPLANT
ELECT DEFIB PAD ADLT CADENCE (PAD) IMPLANT
GLIDESHEATH SLEND A-KIT 6F 22G (SHEATH) IMPLANT
GUIDEWIRE INQWIRE 1.5J.035X260 (WIRE) IMPLANT
KIT ENCORE 26 ADVANTAGE (KITS) IMPLANT
KIT HEMO VALVE WATCHDOG (MISCELLANEOUS) IMPLANT
PACK CARDIAC CATHETERIZATION (CUSTOM PROCEDURE TRAY) ×1 IMPLANT
STATION PROTECTION PRESSURIZED (MISCELLANEOUS) IMPLANT
STENT SYNERGY XD 3.50X20 (Permanent Stent) IMPLANT
WIRE RUNTHROUGH .014X180CM (WIRE) IMPLANT

## 2024-05-01 NOTE — H&P (Addendum)
 CARDIOLOGY ADMIT NOTE     Patient ID: Wesley Davenport MRN: [id number] DOB/AGE: 65-01-1961 65 y.o.  Admit date: 05/01/2024.   Primary Physician:  Zollie Lowers, MD  Patient ID: Wesley Davenport, male    DOB: April 16, 1961, 63 y.o.   MRN: [id number]  Chief Complaint  Patient presents with   Chest Pain   HPI:    Wesley Davenport  is a 63 y.o. Caucasian male patient with hypercholesterolemia, tobacco use disorder quit 1 week ago, hypercholesterolemia, restless leg syndrome presenting to [facility] with chest pain and found to have STEMI inferior wall and hence emergently transferred to [facility].  Patient states that around 11 PM started having severe crushing retrosternal chest pain associated with marked diaphoresis.  Presently upon arrival to the Cath Lab, he was chest pain-free but had minimal ST elevations on telemetry in the inferior leads.  Past Medical History:  Diagnosis Date   Hyperlipidemia    Restless leg syndrome    Past Surgical History:  Procedure Laterality Date   HEMORRHOID SURGERY     Social History   Tobacco Use   Smoking status: Former    Types: Cigarettes   Smokeless tobacco: Never  Substance Use Topics   Alcohol use: Yes    Comment: occ   Family History  Problem Relation Age of Onset   Cancer Mother    Heart disease Father    Heart disease Brother     ROS  Review of Systems  Cardiovascular:  Positive for chest pain. Negative for dyspnea on exertion and leg swelling.   Objective      05/01/2024    3:08 AM 05/01/2024    3:06 AM 05/01/2024    3:05 AM  Vitals with BMI  Height   6' 0  Weight   245 lbs  BMI   33.22  Systolic  174   Diastolic  99   Pulse 81 74       Physical Exam Constitutional:      Appearance: He is obese.  Neck:     Vascular: No carotid bruit or JVD.   Cardiovascular:     Rate and Rhythm: Regular rhythm. Bradycardia present.     Pulses: Intact distal pulses.          Dorsalis pedis pulses are 2+ on the right  side and 0 on the left side.       Posterior tibial pulses are 0 on the right side and 0 on the left side.     Heart sounds: Heart sounds are distant. No murmur heard.    No gallop.  Pulmonary:     Effort: Pulmonary effort is normal.     Breath sounds: Normal breath sounds.  Abdominal:     General: Bowel sounds are normal.     Palpations: Abdomen is soft.   Musculoskeletal:     Right lower leg: No edema.     Left lower leg: No edema.    Laboratory examination:   Recent Labs    07/03/23 1123 04/20/24 1429 05/01/24 0300  NA 139 138 134*  K 4.4 4.6 3.7  CL 101 100 98  CO2 22 23 26   GLUCOSE 94 95 152*  BUN 10 11 10   CREATININE 1.00 1.05 1.14  CALCIUM  9.0 9.5 9.0  GFRNONAA  --   --  >60   estimated creatinine clearance is 83.2 mL/min (by C-G formula based on SCr of 1.14 mg/dL).     Latest Ref Rng & Units 05/01/2024  3:00 AM 04/20/2024    2:29 PM 07/03/2023   11:23 AM  CMP  Glucose 70 - 99 mg/dL 847  95  94   BUN 8 - 23 mg/dL 10  11  10    Creatinine 0.61 - 1.24 mg/dL 8.85  8.94  8.99   Sodium 135 - 145 mmol/L 134  138  139   Potassium 3.5 - 5.1 mmol/L 3.7  4.6  4.4   Chloride 98 - 111 mmol/L 98  100  101   CO2 22 - 32 mmol/L 26  23  22    Calcium  8.9 - 10.3 mg/dL 9.0  9.5  9.0   Total Protein 6.5 - 8.1 g/dL 7.0  7.1  6.9   Total Bilirubin 0.0 - 1.2 mg/dL 0.5  0.3  0.4   Alkaline Phos 38 - 126 U/L 69  91  91   AST 15 - 41 U/L 28  26  21    ALT 0 - 44 U/L 36  38  21       Latest Ref Rng & Units 05/01/2024    3:00 AM 04/20/2024    2:29 PM 07/03/2023   11:23 AM  CBC  WBC 4.0 - 10.5 K/uL 10.1  11.0  11.7   Hemoglobin 13.0 - 17.0 g/dL 84.3  83.3  84.0   Hematocrit 39.0 - 52.0 % 46.9  50.9  48.0   Platelets 150 - 400 K/uL 213  240  268    Lipid Panel     Component Value Date/Time   CHOL 208 (H) 05/01/2024 0300   CHOL 235 (H) 04/20/2024 1429   TRIG 65 05/01/2024 0300   HDL 45 05/01/2024 0300   HDL 55 04/20/2024 1429   CHOLHDL 4.6 05/01/2024 0300   VLDL 13  05/01/2024 0300   LDLCALC 150 (H) 05/01/2024 0300   LDLCALC 166 (H) 04/20/2024 1429   HEMOGLOBIN A1C No results found for: HGBA1C, MPG TSH No results for input(s): TSH in the last 8760 hours. BNP (last 3 results) No results for input(s): BNP in the last 8760 hours. Cardiac Panel (last 3 results) Recent Labs    05/01/24 0300  TROPONINIHS 74*     Medications and allergies  No Known Allergies   Current Outpatient Medications  Medication Instructions   atorvastatin  (LIPITOR) 20 mg, Oral, Daily, For cholesterol   carbidopa -levodopa  (SINEMET  CR) 50-200 MG tablet 2 tablets, Oral, 2 times daily   diclofenac  (VOLTAREN ) 75 mg, Oral, 2 times daily, For muscle and  Joint pain   pramipexole  (MIRAPEX ) 1.5 mg, Oral, 3 times daily   sildenafil  (REVATIO ) 20 MG tablet Take 2-5 pills at once, orally, with each sexual encounter   Vitamin D  (Ergocalciferol ) (DRISDOL ) 50,000 Units, Oral, Every 7 days   Radiology:  No results found.  Cardiac Studies:   EKG 05/01/2024: Normal sinus rhythm, inferior STEMI, low-voltage complexes.  Assessment   1.  Acute inferior STEMI 2.  Mixed hypercholesterolemia 3.  Obesity 4.  History of tobacco use disorder quit 1 week ago 5.  Suspected sleep apnea, long snoring and apneic episodes during cardiac catheterization. 6.  Restless leg syndrome on Sinemet  and Mirapex .  Recommendations:   Patient emergently taken to the cardiac catheter lab to evaluate coronary anatomy. To assess risk factors postcardiac catheterization including lipids and continue abstinence from tobacco.  Addendum: During cardiac catheterization found to have high-grade RCA subtotal occlusion of the proximal segment, patient went into A-fib with RVR during angioplasty.  Underwent successful stenting to the RCA, did  attempt cardioversion at 100 J without success.  As patient appears to have significant sleep apnea, did not want to sedate him any further hence IV amiodarone  started and  we will keep him n.p.o. and can see whether we could cardiovert him earlier this morning.  Patient has been started on IV amiodarone  and also orders have been placed to start heparin  2 hours post sheath removal.  If he indeed converts to sinus rhythm, do not think he needs long-term anticoagulation as atrial fibrillation was induced by ischemia.  Patient will need sleep study.    Gordy Bergamo, MD, [facility] 05/01/2024, 4:58 AM [facility] [address] [place], [place] [address] Phone: [phone]. Fax:  [phone]

## 2024-05-01 NOTE — Progress Notes (Signed)
 CARDIAC REHAB PHASE I      Post MI/stent education including restrictions, risk factors, exercise guidelines, antiplatelet therapy importance, MI booklet, NTG use, heart healthy diet, smoking cessation and CRP2 reviewed. All questions and concerns addressed. Will refer to AP  for CRP2. Will continue to follow.  [id number] Vaughn Asberry Hacking, RN BSN 05/01/2024 9:41 AM

## 2024-05-01 NOTE — ED Provider Notes (Signed)
 [place] EMERGENCY DEPARTMENT AT [facility] Provider Note   CSN: [id number] Arrival date & time: 05/01/24  9746     Patient presents with: Chest Pain   Wesley Davenport is a 63 y.o. male.   The history is provided by the patient.  Chest Pain He has history of hyperlipidemia, restless leg syndrome and comes in because of chest pain which started about 2 hours ago.  He describes a tight feeling in his upper chest radiating to his neck and back.  There has been mild dyspnea and diaphoresis but no nausea or vomiting.  He took ibuprofen at home and nitroglycerin  without relief.  He states he quit smoking 1 week ago, denies history of hypertension or diabetes.   Prior to Admission medications   Medication Sig Start Date End Date Taking? Authorizing Provider  atorvastatin  (LIPITOR) 20 MG tablet Take 1 tablet (20 mg total) by mouth daily. For cholesterol 04/20/24   Zollie Lowers, MD  carbidopa -levodopa  (SINEMET  CR) 50-200 MG tablet Take 2 tablets by mouth 2 (two) times daily. 04/20/24   Zollie Lowers, MD  diclofenac  (VOLTAREN ) 75 MG EC tablet Take 1 tablet (75 mg total) by mouth 2 (two) times daily. For muscle and  Joint pain 04/20/24   Zollie Lowers, MD  pramipexole  (MIRAPEX ) 1.5 MG tablet Take 1 tablet (1.5 mg total) by mouth 3 (three) times daily. 04/20/24   Zollie Lowers, MD  sildenafil  (REVATIO ) 20 MG tablet Take 2-5 pills at once, orally, with each sexual encounter 04/20/24   Zollie Lowers, MD  Vitamin D , Ergocalciferol , (DRISDOL ) 1.25 MG (50000 UNIT) CAPS capsule Take 1 capsule (50,000 Units total) by mouth every 7 (seven) days. 07/10/23   Zollie Lowers, MD    Allergies: Patient has no known allergies.    Review of Systems  Cardiovascular:  Positive for chest pain.  All other systems reviewed and are negative.   Updated Vital Signs BP (!) 174/99   Pulse 81   Temp 97.6 F (36.4 C) (Oral)   Resp 15   Ht 6' (1.829 m)   Wt 111.1 kg   SpO2 94%   BMI 33.23 kg/m    Physical Exam Vitals and nursing note reviewed.   63 year old male, appears uncomfortable, but is in no acute distress. Vital signs are significant for elevated blood pressure. Oxygen saturation is 94%, which is normal. Head is normocephalic and atraumatic. PERRLA, EOMI.  Lungs are clear without rales, wheezes, or rhonchi. Chest is nontender. Heart has regular rate and rhythm without murmur. Abdomen is soft, flat, nontender. Extremities have no cyanosis or edema, full range of motion is present. Skin is warm and dry without rash. Neurologic: Awake and alert, moves all extremities equally.  (all labs ordered are listed, but only abnormal results are displayed) Labs Reviewed  HEMOGLOBIN A1C  CBC WITH DIFFERENTIAL/PLATELET  PROTIME-INR  APTT  COMPREHENSIVE METABOLIC PANEL WITH GFR  LIPID PANEL  LACTIC ACID, PLASMA  TROPONIN I (HIGH SENSITIVITY)    EKG: EKG Interpretation Date/Time:  Saturday May 01 2024 03:05:09 EDT Ventricular Rate:  78 PR Interval:  175 QRS Duration:  87 QT Interval:  395 QTC Calculation: 450 R Axis:   63  Text Interpretation: Sinus rhythm Atrial premature complexes Inferior infarct, acute (RCA) Lateral leads are also involved Probable RV involvement, suggest recording right precordial leads >>> Acute MI <<< No old tracing to compare Confirmed by Raford Lenis ([id number]) on 05/01/2024 3:21:24 AM  Radiology: No results found.   Procedures  Cardiac  monitor shows normal sinus rhythm with occasional PVC, per my interpretation. Medications Ordered in the ED  0.9 %  sodium chloride infusion (has no administration in time range)  aspirin  chewable tablet 324 mg (324 mg Oral Given 05/01/24 0317)  heparin  injection 4,000 Units (4,000 Units Intravenous Given 05/01/24 0317)  morphine  (PF) 4 MG/ML injection 4 mg (4 mg Intravenous Given 05/01/24 0317)  nitroGLYCERIN  (NITROSTAT ) 0.4 MG SL tablet (0.4 mg  Given 05/01/24 9681)                                     Medical Decision Making Amount and/or Complexity of Data Reviewed Labs: ordered. Radiology: ordered.  Risk OTC drugs. Prescription drug management.   Chest discomfort very concerning for ACS.  Differential diagnosis does include pulmonary embolism, pneumonia, pericarditis.  I have reviewed his electrocardiogram, and my interpretation is acute inferior wall myocardial infarction with possible involvement of the inferolateral wall, no prior ECG available for comparison.  I have activated code STEMI.  I have discussed the case with Dr. Ladona on call for STEMI and patient will be transferred emergently to [facility] for emergent PCI.  I have ordered oral aspirin , IV heparin .  I have also ordered IV morphine  for pain.  CRITICAL CARE Performed by: Alm Lias Total critical care time: 45 minutes Critical care time was exclusive of separately billable procedures and treating other patients. Critical care was necessary to treat or prevent imminent or life-threatening deterioration. Critical care was time spent personally by me on the following activities: development of treatment plan with patient and/or surrogate as well as nursing, discussions with consultants, evaluation of patient's response to treatment, examination of patient, obtaining history from patient or surrogate, ordering and performing treatments and interventions, ordering and review of laboratory studies, ordering and review of radiographic studies, pulse oximetry and re-evaluation of patient's condition.  Final diagnoses:  ST elevation myocardial infarction (STEMI), unspecified artery [facility])    ED Discharge Orders     None          Lias Alm, MD 05/01/24 [phone]

## 2024-05-01 NOTE — Progress Notes (Signed)
 NAME:  Wesley Davenport, MRN:  [id number], DOB:  1961/03/01, LOS: 0 ADMISSION DATE:  05/01/2024, CONSULTATION DATE:  05/01/24 REFERRING MD:  Zenaida- AHF, CHIEF COMPLAINT:  Afib, STEMI   History of Present Illness:  Mr. Mccreedy is a 63 y/o gentleman with a history of RLS and tobacco abuse who presented to the hospital with several hours of worsening crushing CP, found to have STEMI.  He had not had previous episodes of chest pain like this. He quit smoking 1 week ago to improve his health. Cardiac risk factors include family history, age, gender, tobacco abuse, HLD. He has no history of OSA, but is up at night very frequently due to his RLS. He has no history of pulmonary disease. Today he has had some nasal congestion that is new. His CP is relieved since he had RCA stent placed. He went into Afib during his heart catheterization, failing to come out of it despite 100J cardioversion x 1 with amiodarone . He was transferred to the ICU on an amiodarone  infusion.   NKDA, no previous issues with anesthesia. No partials/ removable oral implants. No TMJ. No h/o OSA or other pulmonary disease. Drinks about 1 beer per day, no heavy ETOH use. NPO since coming to the hospital.   Pertinent  Medical History  Tobacco abuse HLD  Significant Hospital Events: Including procedures, antibiotic start and stop dates in addition to other pertinent events   6/21 presented, LHC with RCA stent.   Interim History / Subjective:    Objective    Blood pressure (!) 136/97, pulse (!) 108, temperature 97.7 F (36.5 C), temperature source Oral, resp. rate (!) 22, height 6' (1.829 m), weight 111.1 kg, SpO2 93%.        Intake/Output Summary (Last 24 hours) at 05/01/2024 1033 Last data filed at 05/01/2024 0900 Gross per 24 hour  Intake 590.93 ml  Output --  Net 590.93 ml   Filed Weights   05/01/24 0305  Weight: 111.1 kg    Examination: General: elderly man lying in bed in NAD, appears younger than stated age HENT:  [place]/AT, eyes anicteric, Mallampati 3. Nasal sounding voice. Lungs: breathing comfortably on RA, CTAB Cardiovascular: S1S2, tachycardic, irreg rhtyhm Abdomen: soft, NT Extremities: no peripheral edema, R radial cath site with pressure dressing Neuro: awake, alert, moving extremities, answering questions appropriately.  Admission labs reviewed. AM labs pending.  LA had been 2.1 at presentation.  Cologuard screening positive on 04/12/24  Resolved problem list   Assessment and Plan   Inferior STEMI, s/p DES to RCA New onset Afib with RVR -DAPT, will need apixaban  x 1 month for stroke prophylaxis with Afib -statin -Bblocker as tolerated -echo pending -Monitor on tele -Con't amiodarone  -Planning for cardioversion with sedation today. Discussed with patient who consents.  ASA IV due to recent MI, Mallampati 3.  -planning on versed  + ketamine   HLD -high intensity statin  Positive cologuard as OP; no baseline anemia -needs GI evaluation; worry about bleeding with triple therapy -recommend OP referral for GI (does not appear his PCP has done that)  RLS -con't carbidopa  and pramipexole   History of tobacco abuse; ~43 years x 1ppd -congratulated him on quitting -recommend referral for lung cancer screening at discharge  Wife at bedside.   Best Practice (right click and Reselect all SmartList Selections daily)   Per primary  Labs   CBC: Recent Labs  Lab 05/01/24 0300 05/01/24 0851  WBC 10.1 11.5*  NEUTROABS 7.2  --   HGB 15.6 15.6  HCT 46.9 47.9  MCV 93.2 92.5  PLT 213 219    Basic Metabolic Panel: Recent Labs  Lab 05/01/24 0300  NA 134*  K 3.7  CL 98  CO2 26  GLUCOSE 152*  BUN 10  CREATININE 1.14  CALCIUM  9.0   GFR: Estimated Creatinine Clearance: 83.2 mL/min (by C-G formula based on SCr of 1.14 mg/dL). Recent Labs  Lab 05/01/24 0300 05/01/24 0312 05/01/24 0851  WBC 10.1  --  11.5*  LATICACIDVEN  --  2.1*  --     Liver Function Tests: Recent  Labs  Lab 05/01/24 0300  AST 28  ALT 36  ALKPHOS 69  BILITOT 0.5  PROT 7.0  ALBUMIN 3.7   No results for input(s): LIPASE, AMYLASE in the last 168 hours. No results for input(s): AMMONIA in the last 168 hours.  ABG No results found for: PHART, PCO2ART, PO2ART, HCO3, TCO2, ACIDBASEDEF, O2SAT   Coagulation Profile: Recent Labs  Lab 05/01/24 0300  INR 1.0    Cardiac Enzymes: No results for input(s): CKTOTAL, CKMB, CKMBINDEX, TROPONINI in the last 168 hours.  HbA1C: No results found for: HGBA1C  CBG: No results for input(s): GLUCAP in the last 168 hours.  Review of Systems:   Review of Systems  Constitutional:  Negative for chills and fever.  HENT:  Positive for congestion.   Respiratory:  Negative for cough, shortness of breath and wheezing.   Cardiovascular:  Negative for chest pain and leg swelling.  Gastrointestinal: Negative.        Has hemorrhoids, but no recent blood in stool  Genitourinary: Negative.   Neurological:        RLS     Past Medical History:  He,  has a past medical history of Hyperlipidemia and Restless leg syndrome.   Surgical History:   Past Surgical History:  Procedure Laterality Date   HEMORRHOID SURGERY       Social History:   reports that he has quit smoking. His smoking use included cigarettes. He has never used smokeless tobacco. He reports current alcohol use. He reports that he does not use drugs.   Family History:  His family history includes Cancer in his mother; Heart disease in his brother and father.   Allergies No Known Allergies   Home Medications  Prior to Admission medications   Medication Sig Start Date End Date Taking? Authorizing Provider  carbidopa -levodopa  (SINEMET  CR) 50-200 MG tablet Take 2 tablets by mouth 2 (two) times daily. 04/20/24  Yes Stacks, Butler, MD  pramipexole  (MIRAPEX ) 1.5 MG tablet Take 1 tablet (1.5 mg total) by mouth 3 (three) times daily. 04/20/24  Yes Zollie Butler, MD  sildenafil  (REVATIO ) 20 MG tablet Take 2-5 pills at once, orally, with each sexual encounter 04/20/24  Yes Zollie Butler, MD  atorvastatin  (LIPITOR) 20 MG tablet Take 1 tablet (20 mg total) by mouth daily. For cholesterol Patient not taking: Reported on 05/01/2024 04/20/24   Zollie Butler, MD  diclofenac  (VOLTAREN ) 75 MG EC tablet Take 1 tablet (75 mg total) by mouth 2 (two) times daily. For muscle and  Joint pain Patient not taking: Reported on 05/01/2024 04/20/24   Zollie Butler, MD     Critical care time:      Leita SHAUNNA Gaskins, DO 05/01/24 10:33 AM [facility]  For contact information, see Amion. If no response to pager, please call PCCM consult pager. After hours, 7PM- 7AM, please call Elink.

## 2024-05-01 NOTE — Progress Notes (Addendum)
 PHARMACY - ANTICOAGULATION CONSULT NOTE  Pharmacy Consult for heparin  Indication: atrial fibrillation  No Known Allergies  Patient Measurements: Height: 6' (182.9 cm) Weight: 111.1 kg (245 lb) IBW/kg (Calculated) : 77.6 HEPARIN  DW (KG): 101.2  Vital Signs: Temp: 97.7 F (36.5 C) (06/21 0800) Temp Source: Oral (06/21 0800) BP: 150/90 (06/21 0815) Pulse Rate: 114 (06/21 0815)  Labs: Recent Labs    05/01/24 0300  HGB 15.6  HCT 46.9  PLT 213  APTT 25  LABPROT 13.1  INR 1.0  CREATININE 1.14  TROPONINIHS 74*    Estimated Creatinine Clearance: 83.2 mL/min (by C-G formula based on SCr of 1.14 mg/dL).   Medical History: Past Medical History:  Diagnosis Date   Hyperlipidemia    Restless leg syndrome     Assessment: 17 yoM presented to [facility] ED with chest pain and was ana ctivated code STEMI and transferred to [facility]. During cath, patient went into afib RVR w/o successful cardioversion. Pharmacy consulted to dose heparin  for atrial fibrillation two hours after TR band is removed.  -CBC stable -no PTA anticoagulation -Received 14000 unit of heparin  during cath.  Consulted to start heparin  2 hours after TR band removed (removed ~1000). No s/sx of bleeding.   Goal of Therapy:  Heparin  level 0.3-0.7 units/ml Monitor platelets by anticoagulation protocol: Yes   Plan:  Start heparin  infusion at 1400 units/hr two hours after TR band removed Check anti-Xa level in 6 hours and daily while on heparin  Continue to monitor H&H and platelets F/u transition to PO AC   ADDENDUM Discussed with Dr Zenaida, plan for DCCV today. Will change plan from heparin  infusion to apixaban . Given age<80, wt>60 kg, and Scr<1.5 - qualifies for 5 mg. Monitor CBC and s/sx of bleeding.   Thank you for allowing pharmacy to participate in this patient's care,  Suzen Sour, PharmD, BCCCP Clinical Pharmacist  Phone: [phone] 05/01/2024 9:37 AM  Please check AMION for all [facility] Pharmacy phone  numbers After 10:00 PM, call Main Pharmacy [phone]

## 2024-05-01 NOTE — Discharge Instructions (Signed)
Information about your medication: Plavix (anti-platelet agent)  Generic Name (Brand): clopidogrel (Plavix), once daily medication  PURPOSE: You are taking this medication along with aspirin to lower your chance of having a heart attack, stroke, or blood clots in your heart stent. These can be fatal. Plavix and aspirin help prevent platelets from sticking together and forming a clot that can block an artery or your stent.   Common SIDE EFFECTS you may experience include: bruising or bleeding more easily, shortness of breath  Do not stop taking PLAVIX without talking to the doctor who prescribes it for you. People who are treated with a stent and stop taking Plavix too soon, have a higher risk of getting a blood clot in the stent, having a heart attack, or dying. If you stop Plavix because of bleeding, or for other reasons, your risk of a heart attack or stroke may increase.   Avoid taking NSAID agents or anti-inflammatory medications such as ibuprofen, naproxen given increased bleed risk with plavix - can use acetaminophen (Tylenol) if needed for pain.  Avoid taking over the counter stomach medications omeprazole (Prilosec) or esomeprazole (Nexium) since these do interact and make plavix less effective - ask your pharmacist or doctor for alterative agents if needed for heartburn or GERD.   Tell all of your doctors and dentists that you are taking Plavix. They should talk to the doctor who prescribed Plavix for you before you have any surgery or invasive procedure.   Contact your health care provider if you experience: severe or uncontrollable bleeding, pink/red/brown urine, vomiting blood or vomit that looks like "coffee grounds", red or black stools (looks like tar), coughing up blood or blood clots ----------------------------------------------------------------------------------------------------------------------  Information on my medicine - ELIQUIS (apixaban)  Why was Eliquis prescribed  for you? Eliquis was prescribed for you to reduce the risk of a blood clot forming that can cause a stroke if you have a medical condition called atrial fibrillation (a type of irregular heartbeat).  What do You need to know about Eliquis ? Take your Eliquis TWICE DAILY - one tablet in the morning and one tablet in the evening with or without food. If you have difficulty swallowing the tablet whole please discuss with your pharmacist how to take the medication safely.  Take Eliquis exactly as prescribed by your doctor and DO NOT stop taking Eliquis without talking to the doctor who prescribed the medication.  Stopping may increase your risk of developing a stroke.  Refill your prescription before you run out.  After discharge, you should have regular check-up appointments with your healthcare provider that is prescribing your Eliquis.  In the future your dose may need to be changed if your kidney function or weight changes by a significant amount or as you get older.  What do you do if you miss a dose? If you miss a dose, take it as soon as you remember on the same day and resume taking twice daily.  Do not take more than one dose of ELIQUIS at the same time to make up a missed dose.  Important Safety Information A possible side effect of Eliquis is bleeding. You should call your healthcare provider right away if you experience any of the following: ? Bleeding from an injury or your nose that does not stop. ? Unusual colored urine (red or dark brown) or unusual colored stools (red or black). ? Unusual bruising for unknown reasons. ? A serious fall or if you hit your head (even if there  is no bleeding).  Some medicines may interact with Eliquis and might increase your risk of bleeding or clotting while on Eliquis. To help avoid this, consult your healthcare provider or pharmacist prior to using any new prescription or non-prescription medications, including herbals, vitamins,  non-steroidal anti-inflammatory drugs (NSAIDs) and supplements.  This website has more information on Eliquis (apixaban): http://www.eliquis.com/eliquis/home

## 2024-05-01 NOTE — Procedures (Signed)
 Moderate/ conscious sedation note:  During this procedure the patient is administered a total of Versed  3 mg and ketamine  100 mg to achieve and maintain moderate conscious sedation.  The patient's heart rate, blood pressure, and oxygen saturation are monitored continuously during the procedure. The period of conscious sedation is 32 minutes, of which I was present face-to-face 100% of this time.  13:42 versed  2mg  13:46 versed  1mg  13:6  ketamine  100mg  13:47 cardioversion at 200J 13:48 amiodarone  discontinued for bradycardia 14:14 end sedation monitoring  Wesley SHAUNNA Gaskins, DO 05/01/24 2:21 PM [place] [facility]  For contact information, see Amion. If no response to pager, please call PCCM consult pager. After hours, 7PM- 7AM, please call Elink.

## 2024-05-01 NOTE — ED Triage Notes (Signed)
 Pt to ed pov c/o CP that started around 0100 with SOB and sweating. Pt reports no prior cardiac hx. Pt took 2 nitroglycerin  pills with no relief

## 2024-05-01 NOTE — ED Notes (Signed)
 Attempted X2 to call Cath lab with no answer. Report was given to transport by this nurse.

## 2024-05-01 NOTE — Progress Notes (Signed)
 RT at bedside w/ NRB, ambu bag and glidescope for pt's cardioversion

## 2024-05-01 NOTE — Plan of Care (Signed)

## 2024-05-01 NOTE — CV Procedure (Signed)
   DIRECT CURRENT CARDIOVERSION  NAME:  Wesley Davenport    MRN: [id number] DOB:  01/24/1961    ADMIT DATE: 05/01/2024  CARDIOVERSION:     Indications:  Symptomatic Atrial Fibrillation  Informed consent was obtained prior to the procedure. The risks, benefits and alternatives for the procedure were discussed and the patient comprehended these risks.  Risks include, but are not limited to treatment failure, burns to the chest, pain/discomfort, ventricular arrhythmia.   After a procedural time-out, sedation was performed by anesthesia. The patient had the defibrillator pads placed in the anterior and posterior position. Once an appropriate level of sedation was confirmed, the patient was cardioverted successfully with 200J of biphasic synchronized energy.  The patient converted to NSR.  There were no apparent complications.  The patient had normal neuro status and respiratory status post procedure with vitals stable as recorded elsewhere.  Adequate airway was maintained throughout and vital signs monitored per protocol.  Since recent cardioversion will need 1 month minimum of OAC. Given eliquis  prior to the procedure, will transition to plavix as well. Plan for triple therapy while in house, drop aspirin  at discharge, especially given recent positive cologuard  COMPLICATIONS:    Complications: No complications Patient tolerated procedure well.  Morene Brownie Advanced Heart Failure 3:05 PM

## 2024-05-01 NOTE — Progress Notes (Signed)
 Underwent cardioversion this afternoon, original ketamine  order for 111 mg (3 50 mg syringes) but adjusted to 100 mg which was given along with versed  2 mg (an additional 1 mg was also given for total 3 mg).   Pharmacy was going to return extra ketamine  syringe but taper seal broke before could be returned - 1 mg versed  and 50 mg ketamine  wasted with pharmacist and Laurin RN.  Thank you for allowing pharmacy to participate in this patient's care,  Suzen Sour, PharmD, BCCCP Clinical Pharmacist  Phone: [phone] 05/01/2024 2:04 PM  Please check AMION for all [facility] Pharmacy phone numbers After 10:00 PM, call Main Pharmacy [phone]

## 2024-05-02 ENCOUNTER — Encounter (HOSPITAL_COMMUNITY): Payer: Self-pay | Admitting: Cardiology

## 2024-05-02 ENCOUNTER — Telehealth: Payer: Self-pay | Admitting: Student

## 2024-05-02 ENCOUNTER — Inpatient Hospital Stay (HOSPITAL_COMMUNITY)

## 2024-05-02 DIAGNOSIS — I2119 ST elevation (STEMI) myocardial infarction involving other coronary artery of inferior wall: Secondary | ICD-10-CM

## 2024-05-02 DIAGNOSIS — I2510 Atherosclerotic heart disease of native coronary artery without angina pectoris: Secondary | ICD-10-CM | POA: Insufficient documentation

## 2024-05-02 LAB — CBC
HCT: 46.6 % (ref 39.0–52.0)
Hemoglobin: 15.1 g/dL (ref 13.0–17.0)
MCH: 30.2 pg (ref 26.0–34.0)
MCHC: 32.4 g/dL (ref 30.0–36.0)
MCV: 93.2 fL (ref 80.0–100.0)
Platelets: 213 10*3/uL (ref 150–400)
RBC: 5 MIL/uL (ref 4.22–5.81)
RDW: 14.5 % (ref 11.5–15.5)
WBC: 12.5 10*3/uL — ABNORMAL HIGH (ref 4.0–10.5)
nRBC: 0 % (ref 0.0–0.2)

## 2024-05-02 LAB — BASIC METABOLIC PANEL WITH GFR
Anion gap: 7 (ref 5–15)
BUN: 9 mg/dL (ref 8–23)
CO2: 30 mmol/L (ref 22–32)
Calcium: 8.9 mg/dL (ref 8.9–10.3)
Chloride: 100 mmol/L (ref 98–111)
Creatinine, Ser: 1.17 mg/dL (ref 0.61–1.24)
GFR, Estimated: >60 mL/min (ref >60)
Glucose, Bld: 98 mg/dL (ref 70–99)
Potassium: 4 mmol/L (ref 3.5–5.1)
Sodium: 137 mmol/L (ref 135–145)

## 2024-05-02 LAB — ECHOCARDIOGRAM COMPLETE
AR max vel: 3.26 cm2
AV Area VTI: 2.93 cm2
AV Area mean vel: 2.96 cm2
AV Mean grad: 4 mmHg
AV Peak grad: 8.6 mmHg
Ao pk vel: 1.47 m/s
Area-P 1/2: 3.37 cm2
Calc EF: 59.6 %
Height: 72 in
S' Lateral: 4.1 cm
Single Plane A2C EF: 56.9 %
Single Plane A4C EF: 63.1 %
Weight: 3920 [oz_av]

## 2024-05-02 LAB — HEMOGLOBIN A1C
Hgb A1c MFr Bld: 6 % — ABNORMAL HIGH (ref 4.8–5.6)
Mean Plasma Glucose: 125.5 mg/dL

## 2024-05-02 MED ORDER — LOSARTAN POTASSIUM 25 MG PO TABS: 25.0000 mg | ORAL_TABLET | Freq: Every day | ORAL | 2 refills | Status: DC

## 2024-05-02 MED ORDER — ATORVASTATIN CALCIUM 80 MG PO TABS: 80.0000 mg | ORAL_TABLET | Freq: Every day | ORAL | 11 refills | Status: AC

## 2024-05-02 MED ORDER — NITROGLYCERIN 0.4 MG SL SUBL: 0.4000 mg | SUBLINGUAL_TABLET | SUBLINGUAL | 2 refills | Status: AC | PRN

## 2024-05-02 MED ORDER — CLOPIDOGREL BISULFATE 75 MG PO TABS: 75.0000 mg | ORAL_TABLET | Freq: Every day | ORAL | 11 refills | Status: DC

## 2024-05-02 MED ORDER — PERFLUTREN LIPID MICROSPHERE
1.0000 mL | INTRAVENOUS | Status: AC | PRN
  Administered 2024-05-02: 2 mL via INTRAVENOUS

## 2024-05-02 MED ORDER — LOSARTAN POTASSIUM 25 MG PO TABS
25.0000 mg | ORAL_TABLET | Freq: Every day | ORAL | Status: DC
  Administered 2024-05-02: 25 mg via ORAL
  Filled 2024-05-02: qty 1

## 2024-05-02 MED ORDER — METOPROLOL SUCCINATE ER 25 MG PO TB24: 25.0000 mg | ORAL_TABLET | Freq: Every day | ORAL | 2 refills | Status: DC

## 2024-05-02 MED ORDER — ASPIRIN 81 MG PO CHEW: 81.0000 mg | CHEWABLE_TABLET | Freq: Every day | ORAL | 0 refills | Status: DC

## 2024-05-02 MED ORDER — APIXABAN 5 MG PO TABS: 5.0000 mg | ORAL_TABLET | Freq: Two times a day (BID) | ORAL | 2 refills | Status: DC

## 2024-05-02 NOTE — Plan of Care (Signed)
  Problem: Activity: Goal: Risk for activity intolerance will decrease Outcome: Progressing   Problem: Elimination: Goal: Will not experience complications related to bowel motility Outcome: Progressing   Problem: Pain Managment: Goal: General experience of comfort will improve and/or be controlled Outcome: Progressing   Problem: Safety: Goal: Ability to remain free from injury will improve Outcome: Progressing

## 2024-05-02 NOTE — Progress Notes (Signed)
*  PRELIMINARY RESULTS* Echocardiogram 2D Echocardiogram has been performed.  Eva JONETTA Lash 05/02/2024, 8:50 AM

## 2024-05-02 NOTE — Care Management (Signed)
 Provided patient 30 day Eliquis  card

## 2024-05-02 NOTE — Telephone Encounter (Signed)
   Transition of Care Follow-up Phone Call Request    Patient Name: Wesley Davenport Date of Birth: 1961/10/02 Date of Encounter: 05/02/2024  Primary Care Provider:  Zollie Lowers, MD Primary Cardiologist:  None  [name] has been scheduled for a transition of care follow up appointment with a [facility] provider:  Raphael Bring, PA-C, 05/12/2024 at 8:50am.  Please reach out to [facility] within 48 hours of discharge to confirm appointment and review transition of care protocol questionnaire. Anticipated discharge date: 05/02/2024  Aline FORBES Door, PA-C  05/02/2024, 2:06 PM

## 2024-05-02 NOTE — Discharge Summary (Signed)
 Discharge Summary   Patient ID: Wesley Davenport MRN: [id number]; DOB: 24-Sep-1961  Admit date: 05/01/2024 Discharge date: 05/02/2024  PCP:  Zollie Lowers, MD   [facility] Providers Cardiologist:  New (Dr. Ladona)  Discharge Diagnoses  Principal Problem:   Acute inferior myocardial infarction [facility]) Active Problems:   Status post coronary artery stent placement   CAD (coronary artery disease)   Paroxysmal atrial fibrillation (HCC)   Restless leg syndrome, controlled   Hyperlipemia   Positive colorectal cancer screening using Cologuard test   History of tobacco abuse   Diagnostic Studies/Procedures   Left Cardiac Catheterization 05/01/2024:   Prox RCA-1 lesion is 30% stenosed.   Hemodynamic data: LVEDP 12 mmHg.  There is no pressure gradient across the aortic valve.   Angiographic data: LM: Large-caliber vessel, smooth and normal. LAD: Moderate large-caliber vessel with mild diffuse disease and in the proximal segment diffuse 40% stenosis followed by a tandem 30% stenosis.  Gives origin to large D1 with secondary branch which also has mild diffuse disease. CX: Very large caliber vessel, appears codominant with RCA.  OM1 with a soft plaque 40% stenosis, tiny OM 2 and a large OM 3.  There is a 60% stenosis between OM 2 and 3 in the mid CX. RCA: Large-caliber vessel.  Proximal segment has a 99% stenosis which is ulcerated.   Intervention data: Successful PTCA and stenting of the proximal RCA with implantation of a 3.5 x 20 mm Synergy XD DES, stenosis reduced from 99% to 0% with TIMI III to TIMI-3 flow.    Impression and recommendations: Patient received direct-current cardioversion x 1 at 100 J but persisted in atrial fibrillation that started during PCI.  Patient was given bolus of amiodarone  150 mg and IV drip started if he persistent A-fib, consider cardioversion this morning, heparin  drip has been ordered as well.  Patient was started on aspirin  along with prasugrel .     Echocardiogram 05/02/2024: Impressions:  1. Left ventricular ejection fraction, by estimation, is 55 to 60%. The  left ventricle has normal function. The left ventricle demonstrates  regional wall motion abnormalities (see scoring diagram/findings for  description). There is mild left ventricular   hypertrophy. Left ventricular diastolic parameters are consistent with  Grade I diastolic dysfunction (impaired relaxation).   2. Right ventricular systolic function is normal. The right ventricular  size is normal. There is normal pulmonary artery systolic pressure. The  estimated right ventricular systolic pressure is 23.1 mmHg.   3. The mitral valve is normal in structure. No evidence of mitral valve  regurgitation. No evidence of mitral stenosis.   4. The aortic valve is normal in structure. Aortic valve regurgitation is  not visualized. No aortic stenosis is present.   5. The inferior vena cava is dilated in size with >50% respiratory  variability, suggesting right atrial pressure of 8 mmHg.    _____________   History of Present Illness   Wesley Davenport is a 63 y.o. male with a history of hyperlipidemia, restless leg syndrome, and tobacco abuse presented to the [facility] ED on 05/01/2024 with severe crushing retrosternal chest pain associated with marked diaphoresis and found to have inferior wall STEMI. He was transferred to [facility] for emergent cardiac catheterization.  Hospital Course   Consultants: PCCM   Acute STEMI CAD Patient was admitted with acute STEMI after presenting with severe chest pain as above. High-sensitivity troponin 74 >> 3,064. Emergent LHC showed 99% stenosis of proximal RCA and otherwise only mild disease. He underwent  successful PCI with DES to RCA. Echo showed LVEF of 55-60% with hypokinesis of inferior wall and grade 1 diastolic dysfunction. He was initially started on DAPT with Aspirin  and Effient . However, given need for anticoagulation in setting of  atrial fibrillation, Effient  was stopped and he was switched to Plavix. Will continue Aspirin  for a total of 5 days and then stop given recent positive hemoccult and need for Eliquis  at discharge. Continue Plavix 75mg  daily for at least 12 months. He was also started on Toprol -XL 25mg  daily and Losartan 25mg  daily and home Lipitor was increased to 80mg  daily. Recommend repeat CBC and BMET at follow-up visit.  New Onset Atrial Fibrillation He went into atrial fibrillation with RVR during cardiac catheterization. He was started on IV Amiodarone  and IV Heparin  in the cath lab. He was then started on Eliquis  on 05/01/2024 and underwent DCCV with restoration of sinus rhythm. Critical Care team was consulted to assist with sedation for DCCV. Amiodarone  was subsequently stopped due to bradycardia. Continue Toprol -XL 25mg  daily and Eliquis  5mg  twice daily at discharge. Eliquis  may be able to be stopped after 4 weeks given atrial fibrillation occurred in setting of STEMI.   Of note, our inpatient Pharmacy team ran the cost of Eliquis  and got a claim back that Eliquis  would cost $300. The though was that this was just deductible related but we were unable to get full details on the weekend. He was provided 30 day free card at discharge. However, if Eliquis  is continued for longer than 4 weeks, we should double check the price of this for patient as an outpatient.  Hyperlipidemia Lipid panel this admission: Total Cholesterol 208, Triglycerides 65, HDL 45, LDL 150. LDL goal <70 given CAD. Home Lipitor was increased to 80mg  daily. Will need repeat lipid panel and LFTs in 6-8 weeks.  Prediabetes Hemoglobin A1c 6.0%.  Restless Leg Syndrome Continue home Pramipexole  and Carbidopa -Levodopa .  Tobacco Abuse He was counseled on the importance of complete cessation.   Recent Positive Cologuard  He recently had a positive cologuard test earlier this month on 04/12/2024. He was referred to GI for a colonoscopy. He will be  discharged on triple therapy with Aspirin , Plavix, and Eliquis  as above but plan is only to continue Aspirin  for a total of 5 days. Advised patient to monitor for any abnormal bleeding in stools.  Patient was seen and examined by Dr. Zenaida today and determined to be stable for discharge. Outpatient follow-up arranged. Medications as below.    Did the patient have an acute coronary syndrome (MI, NSTEMI, STEMI, etc) this admission?:  Yes                               AHA/ACC ACS Clinical Performance & Quality Measures: Aspirin  prescribed? - Yes ADP Receptor Inhibitor (Plavix/Clopidogrel, Brilinta/Ticagrelor or Effient /Prasugrel ) prescribed (includes medically managed patients)? - Yes Beta Blocker prescribed? - Yes High Intensity Statin (Lipitor 40-80mg  or Crestor 20-40mg ) prescribed? - Yes EF assessed during THIS hospitalization? - Yes For EF <40%, was ACEI/ARB prescribed? - Not Applicable (EF >/= 40%) For EF <40%, Aldosterone Antagonist (Spironolactone or Eplerenone) prescribed? - Not Applicable (EF >/= 40%) Cardiac Rehab Phase II ordered (including medically managed patients)? - Yes    The patient will be scheduled for a TOC follow up appointment in within 14 days.  A message has been sent to the [facility] and Scheduling Pool at the office where the patient should be  seen for follow up.  _____________  Discharge Vitals Blood pressure 135/87, pulse 65, temperature 97.8 F (36.6 C), temperature source Axillary, resp. rate 20, height 6' (1.829 m), weight 111.1 kg, SpO2 92%.  Filed Weights   05/01/24 0305  Weight: 111.1 kg    Labs & Radiologic Studies  CBC Recent Labs    05/01/24 0300 05/01/24 0851 05/02/24 1015  WBC 10.1 11.5* 12.5*  NEUTROABS 7.2  --   --   HGB 15.6 15.6 15.1  HCT 46.9 47.9 46.6  MCV 93.2 92.5 93.2  PLT 213 219 213   Basic Metabolic Panel Recent Labs    93/78/74 0851 05/02/24 1015  NA 138 137  K 4.3 4.0  CL 103 100  CO2 25 30  GLUCOSE 127* 98  BUN 7*  9  CREATININE 1.05 1.17  CALCIUM  9.0 8.9   Liver Function Tests Recent Labs    05/01/24 0300  AST 28  ALT 36  ALKPHOS 69  BILITOT 0.5  PROT 7.0  ALBUMIN 3.7   No results for input(s): LIPASE, AMYLASE in the last 72 hours. High Sensitivity Troponin:   Recent Labs  Lab 05/01/24 0300 05/01/24 0851  TROPONINIHS 74* 3,064*    No results for input(s): TRNPT in the last 720 hours.  BNP Invalid input(s): POCBNP No results for input(s): PROBNP in the last 72 hours.  No results for input(s): BNP in the last 72 hours.  D-Dimer No results for input(s): DDIMER in the last 72 hours. Hemoglobin A1C Recent Labs    05/01/24 0300  HGBA1C 6.0*   Fasting Lipid Panel Recent Labs    05/01/24 0300  CHOL 208*  HDL 45  LDLCALC 150*  TRIG 65  CHOLHDL 4.6   No results found for: LIPOA  Thyroid Function Tests No results for input(s): TSH, T4TOTAL, T3FREE, THYROIDAB in the last 72 hours.  Invalid input(s): FREET3 _____________  ECHOCARDIOGRAM COMPLETE Result Date: 05/02/2024    ECHOCARDIOGRAM REPORT   Patient Name:   Amadi Prows Date of Exam: 05/02/2024 Medical Rec #:  [id number]     Height:       72.0 in Accession #:    [id number]    Weight:       245.0 lb Date of Birth:  [birth year]     BSA:          2.322 m Patient Age:    63 years      BP:           169/91 mmHg Patient Gender: M             HR:           58 bpm. Exam Location:  Inpatient Procedure: 2D Echo, Color Doppler and Cardiac Doppler (Both Spectral and Color            Flow Doppler were utilized during procedure). Indications:    Acute inferior myocardial infarction  History:        Patient has no prior history of Echocardiogram examinations.  Sonographer:    Eva Lash Referring Phys: [id number] GORDY BERGAMO IMPRESSIONS  1. Left ventricular ejection fraction, by estimation, is 55 to 60%. The left ventricle has normal function. The left ventricle demonstrates regional wall motion abnormalities (see scoring  diagram/findings for description). There is mild left ventricular  hypertrophy. Left ventricular diastolic parameters are consistent with Grade I diastolic dysfunction (impaired relaxation).  2. Right ventricular systolic function is normal. The right ventricular size is normal. There is normal pulmonary artery  systolic pressure. The estimated right ventricular systolic pressure is 23.1 mmHg.  3. The mitral valve is normal in structure. No evidence of mitral valve regurgitation. No evidence of mitral stenosis.  4. The aortic valve is normal in structure. Aortic valve regurgitation is not visualized. No aortic stenosis is present.  5. The inferior vena cava is dilated in size with >50% respiratory variability, suggesting right atrial pressure of 8 mmHg. FINDINGS  Left Ventricle: Left ventricular ejection fraction, by estimation, is 55 to 60%. The left ventricle has normal function. The left ventricle demonstrates regional wall motion abnormalities. Definity contrast agent was given IV to delineate the left ventricular endocardial borders. The left ventricular internal cavity size was normal in size. There is mild left ventricular hypertrophy. Left ventricular diastolic parameters are consistent with Grade I diastolic dysfunction (impaired relaxation).  LV Wall Scoring: The inferior wall is hypokinetic. Right Ventricle: The right ventricular size is normal. No increase in right ventricular wall thickness. Right ventricular systolic function is normal. There is normal pulmonary artery systolic pressure. The tricuspid regurgitant velocity is 1.94 m/s, and  with an assumed right atrial pressure of 8 mmHg, the estimated right ventricular systolic pressure is 23.1 mmHg. Left Atrium: Left atrial size was normal in size. Right Atrium: Right atrial size was normal in size. Pericardium: There is no evidence of pericardial effusion. Mitral Valve: The mitral valve is normal in structure. No evidence of mitral valve  regurgitation. No evidence of mitral valve stenosis. Tricuspid Valve: The tricuspid valve is normal in structure. Tricuspid valve regurgitation is trivial. No evidence of tricuspid stenosis. Aortic Valve: The aortic valve is normal in structure. Aortic valve regurgitation is not visualized. No aortic stenosis is present. Aortic valve mean gradient measures 4.0 mmHg. Aortic valve peak gradient measures 8.6 mmHg. Aortic valve area, by VTI measures 2.93 cm. Pulmonic Valve: The pulmonic valve was normal in structure. Pulmonic valve regurgitation is not visualized. No evidence of pulmonic stenosis. Aorta: The aortic root is normal in size and structure. Venous: The inferior vena cava is dilated in size with greater than 50% respiratory variability, suggesting right atrial pressure of 8 mmHg. IAS/Shunts: No atrial level shunt detected by color flow Doppler.  LEFT VENTRICLE PLAX 2D LVIDd:         5.50 cm      Diastology LVIDs:         4.10 cm      LV e' medial:    6.64 cm/s LV PW:         1.30 cm      LV E/e' medial:  11.0 LV IVS:        1.00 cm      LV e' lateral:   6.20 cm/s LVOT diam:     2.40 cm      LV E/e' lateral: 11.7 LV SV:         88 LV SV Index:   38 LVOT Area:     4.52 cm  LV Volumes (MOD) LV vol d, MOD A2C: 123.0 ml LV vol d, MOD A4C: 131.0 ml LV vol s, MOD A2C: 53.0 ml LV vol s, MOD A4C: 48.4 ml LV SV MOD A2C:     70.0 ml LV SV MOD A4C:     131.0 ml LV SV MOD BP:      76.7 ml RIGHT VENTRICLE RV S prime:     10.70 cm/s TAPSE (M-mode): 1.8 cm LEFT ATRIUM  Index LA diam:        4.10 cm 1.77 cm/m LA Vol (A2C):   45.1 ml 19.42 ml/m LA Vol (A4C):   42.6 ml 18.34 ml/m LA Biplane Vol: 45.1 ml 19.42 ml/m  AORTIC VALVE AV Area (Vmax):    3.26 cm AV Area (Vmean):   2.96 cm AV Area (VTI):     2.93 cm AV Vmax:           147.00 cm/s AV Vmean:          88.600 cm/s AV VTI:            0.301 m AV Peak Grad:      8.6 mmHg AV Mean Grad:      4.0 mmHg LVOT Vmax:         106.00 cm/s LVOT Vmean:        57.900  cm/s LVOT VTI:          0.195 m LVOT/AV VTI ratio: 0.65  AORTA Ao Asc diam: 3.20 cm MITRAL VALVE               TRICUSPID VALVE MV Area (PHT): 3.37 cm    TR Peak grad:   15.1 mmHg MV Decel Time: 225 msec    TR Vmax:        194.00 cm/s MV E velocity: 72.80 cm/s MV A velocity: 73.40 cm/s  SHUNTS MV E/A ratio:  0.99        Systemic VTI:  0.20 m                            Systemic Diam: 2.40 cm Oneil Parchment MD Electronically signed by Oneil Parchment MD Signature Date/Time: 05/02/2024/11:13:35 AM    Final    CARDIAC CATHETERIZATION Result Date: 05/01/2024 Images from the original result were not included.   Prox RCA-1 lesion is 30% stenosed. Cardiac Catheterization 05/01/24: Hemodynamic data: LVEDP 12 mmHg.  There is no pressure gradient across the aortic valve. Angiographic data: LM: Large-caliber vessel, smooth and normal. LAD: Moderate large-caliber vessel with mild diffuse disease and in the proximal segment diffuse 40% stenosis followed by a tandem 30% stenosis.  Gives origin to large D1 with secondary branch which also has mild diffuse disease. CX: Very large caliber vessel, appears codominant with RCA.  OM1 with a soft plaque 40% stenosis, tiny OM 2 and a large OM 3.  There is a 60% stenosis between OM 2 and 3 in the mid CX. RCA: Large-caliber vessel.  Proximal segment has a 99% stenosis which is ulcerated. Intervention data: Successful PTCA and stenting of the proximal RCA with implantation of a 3.5 x 20 mm Synergy XD DES, stenosis reduced from 99% to 0% with TIMI III to TIMI-3 flow. Impression and recommendations: Patient received direct-current cardioversion x 1 at 100 J but persisted in atrial fibrillation that started during PCI.  Patient was given bolus of amiodarone  150 mg and IV drip started if he persistent A-fib, consider cardioversion this morning, heparin  drip has been ordered as well.  Patient was started on aspirin  along with prasugrel .   EP STUDY Result Date: 05/01/2024 Images from the original  result were not included.   Prox RCA-1 lesion is 30% stenosed. Cardiac Catheterization 05/01/24: Hemodynamic data: LVEDP 12 mmHg.  There is no pressure gradient across the aortic valve. Angiographic data: LM: Large-caliber vessel, smooth and normal. LAD: Moderate large-caliber vessel with mild diffuse disease and in the proximal segment diffuse  40% stenosis followed by a tandem 30% stenosis.  Gives origin to large D1 with secondary branch which also has mild diffuse disease. CX: Very large caliber vessel, appears codominant with RCA.  OM1 with a soft plaque 40% stenosis, tiny OM 2 and a large OM 3.  There is a 60% stenosis between OM 2 and 3 in the mid CX. RCA: Large-caliber vessel.  Proximal segment has a 99% stenosis which is ulcerated. Intervention data: Successful PTCA and stenting of the proximal RCA with implantation of a 3.5 x 20 mm Synergy XD DES, stenosis reduced from 99% to 0% with TIMI III to TIMI-3 flow. Impression and recommendations: Patient received direct-current cardioversion x 1 at 100 J but persisted in atrial fibrillation that started during PCI.  Patient was given bolus of amiodarone  150 mg and IV drip started if he persistent A-fib, consider cardioversion this morning, heparin  drip has been ordered as well.  Patient was started on aspirin  along with prasugrel .    Disposition Patient is being discharged home today in good condition.  Follow-up Plans & Appointments  Follow-up Information     Abigail Raphael SAILOR, PA-C Follow up.   Specialties: Cardiology, Radiology Why: Hospital follow-up with Cardiology scheduled for 05/12/2024 at 8:50am. Please arrive 15 minutes early for check-in. If this date/ time does not work for you, please call our office to reschedule. Contact information: [address] [place] [address] [phone]                Discharge Instructions     Amb Referral to Cardiac Rehabilitation   Complete by: As directed    Diagnosis:  STEMI Coronary  Stents     After initial evaluation and assessments completed: Virtual Based Care may be provided alone or in conjunction with Phase 2 Cardiac Rehab based on patient barriers.: Yes   Intensive Cardiac Rehabilitation (ICR) [facility] location only OR Traditional Cardiac Rehabilitation (TCR) *If criteria for ICR are not met will enroll in TCR [facility] only): Yes   Diet - low sodium heart healthy   Complete by: As directed    Increase activity slowly   Complete by: As directed        Discharge Medications Allergies as of 05/02/2024   No Known Allergies      Medication List     STOP taking these medications    diclofenac  75 MG EC tablet Commonly known as: VOLTAREN        TAKE these medications    apixaban  5 MG Tabs tablet Commonly known as: ELIQUIS  Take 1 tablet (5 mg total) by mouth 2 (two) times daily. Notes to patient: Next dose 6/22 PM   aspirin  81 MG chewable tablet Chew 1 tablet (81 mg total) by mouth daily. Start taking on: May 03, 2024 Notes to patient: Next dose 6/23, last dose 6/27   atorvastatin  80 MG tablet Commonly known as: LIPITOR Take 1 tablet (80 mg total) by mouth daily. Start taking on: May 03, 2024 What changed:  medication strength how much to take additional instructions Notes to patient: Next dose 6/23   carbidopa -levodopa  50-200 MG tablet Commonly known as: SINEMET  CR Take 2 tablets by mouth 2 (two) times daily.   clopidogrel 75 MG tablet Commonly known as: PLAVIX Take 1 tablet (75 mg total) by mouth daily. Start taking on: May 03, 2024 Notes to patient: Next dose 6/23 AM   losartan 25 MG tablet Commonly known as: COZAAR Take 1 tablet (25 mg total) by mouth daily. Start taking  on: May 03, 2024 Notes to patient: Next dose 6/23 AM   metoprolol  succinate 25 MG 24 hr tablet Commonly known as: TOPROL -XL Take 1 tablet (25 mg total) by mouth daily. Start taking on: May 03, 2024 Notes to patient: Next dose 6/23 AM   nitroGLYCERIN  0.4 MG SL  tablet Commonly known as: Nitrostat  Place 1 tablet (0.4 mg total) under the tongue every 5 (five) minutes as needed for chest pain. Do not take within 24 hours of sildenafil  Notes to patient: Do not take within 24 hours of sildenafil    pramipexole  1.5 MG tablet Commonly known as: MIRAPEX  Take 1 tablet (1.5 mg total) by mouth 3 (three) times daily.   sildenafil  20 MG tablet Commonly known as: REVATIO  Take 2-5 pills at once, orally, with each sexual encounter Notes to patient: Do not take within 24 hours of nitroglycerin  tablets         Outstanding Labs/Studies  Repeat CBC and BMET at follow-up visit. Repeat lipid panel and LFTs in 6-8 weeks.  Duration of Discharge Encounter: APP Time: 20 minutes   Signed, Luciana Cammarata E Abrie Egloff, PA-C 05/02/2024, 2:02 PM

## 2024-05-03 LAB — LIPOPROTEIN A (LPA): Lipoprotein (a): 22 nmol/L (ref <75.0)

## 2024-05-03 NOTE — Telephone Encounter (Signed)
TOC attempt 1

## 2024-05-04 ENCOUNTER — Telehealth (HOSPITAL_COMMUNITY): Payer: Self-pay

## 2024-05-04 NOTE — Telephone Encounter (Signed)
 Hello,  Wesley Davenport is a 63 y.o. male (MRN: [id number], DOB: 20-Nov-1960) who was recently hospitalized at [facility] following RCA STEMI complicated by atrial fibrillation.  Patient was started on apixaban  for afib during recent hospitalization. Copay check was completed by [facility] pharmacy finding copay ~$300/month but unclear if related to deductible given occurred over the weekend. Patient was able to use initial copay card for 30 days free at discharge (was provided along with education at that time). Recommend checking monthly copay cost with patient at follow up appointment to make sure affordable if remains on for long term therapy.     Thank you for allowing pharmacy to participate in this patient's care,  Suzen Sour, PharmD, BCCCP Clinical Pharmacist  Phone: [phone] 05/04/2024 10:37 PM  Please check AMION for all [facility] Pharm[facility] phone numbers After 10:00 PM, call Main Pharmacy [phone]

## 2024-05-04 NOTE — Telephone Encounter (Signed)
 2nd attempt  Called patient. Someone answered and said another call is coming through. The call was disconnected.  Will try again.

## 2024-05-05 MED FILL — Amiodarone HCl Inj 150 MG/3ML (50 MG/ML): INTRAVENOUS | Qty: 3 | Status: AC

## 2024-05-05 NOTE — Telephone Encounter (Signed)
 Noted

## 2024-05-05 NOTE — Telephone Encounter (Signed)
 Patient contacted regarding discharge from [facility] on 05/02/24.  Patient understands to follow up with provider Dayna Dunn PA-C on 05/12/24 at 0850 at [facility] location. Patient understands discharge instructions? yes Patient understands medications and regiment? yes Patient understands to bring all medications to this visit? yes

## 2024-05-11 ENCOUNTER — Other Ambulatory Visit: Payer: Self-pay | Admitting: Family Medicine

## 2024-05-11 DIAGNOSIS — G8929 Other chronic pain: Secondary | ICD-10-CM

## 2024-05-11 NOTE — Progress Notes (Unsigned)
 Cardiology Office Note    Date:  05/12/2024  ID:  [name], DOB 11-07-1961, MRN [id number] PCP:  Zollie Lowers, MD  Cardiologist:  Gordy Bergamo, MD  Electrophysiologist:  None   Chief Complaint: post MI f/u  History of Present Illness: .    Wesley Davenport is a 63 y.o. male with visit-pertinent history of CAD with recent STEMI s/p DES to RCA, PAF, HLD, preDM, RLS, ED, chronic knee pain, former tobacco abuse, recent positive cologuard, leucocytosis by prior labs seen for follow-up. Recently admitted 04/2024 with STEMI - Emergent LHC showed 99% stenosis of proximal RCA and otherwise only mild disease. He underwent successful PCI with DES to RCA. Echo showed LVEF of 55-60% with hypokinesis of inferior wall and grade 1 diastolic dysfunction. He was initially started on DAPT with Aspirin  and Effient . However, he developed AF RVR during cath - treated with IV amiodarone  + IV heparin  in the cath lab. He was then started on Eliquis  on 05/01/2024 and underwent DCCV with restoration of sinus rhythm. Amiodarone  was subsequently stopped due to bradycardia. Given need for anticoagulation in setting of atrial fibrillation, Effient  was stopped and he was switched to Plavix . Plan was to continue ASA x 5 days then discontinue given recent positive cologuard pending colonoscopy prior to admission. Discharge summary indicated that Eliquis  could potentially be stopped after 4 weeks given atrial fibrillation occurred in setting of STEMI.   He returns for follow-up today reporting he feels great. In retrospect, he now realizes some of the activity intolerance he had attributed solely to knee problems may have been heart related as well. Denies any recurrent CP. No SOB, palpitations, edema. Tolerating meds well. Occasionally following BP at home and seeing variable readings, at times elevated but precise numbers not known. He has quit smoking!  Labwork independently reviewed: 04/2024 Hgb 15.1, plt 213, K 4.0, Cr 1.17, Lpa  ok, trop 3k, LDL 150, trig 65, LFTs ok, A1c 6.0 2019 TSH OK  ROS: .    Please see the history of present illness All other systems are reviewed and otherwise negative.  Studies Reviewed: SABRA    EKG:  EKG is ordered today, personally reviewed, demonstrating  EKG Interpretation Date/Time:  Wednesday May 12 2024 08:43:51 EDT Ventricular Rate:  65 PR Interval:  154 QRS Duration:  82 QT Interval:  396 QTC Calculation: 411 R Axis:   -62  Text Interpretation: Sinus rhythm with marked sinus arrhythmia versus PACs Left axis deviation Low voltage QRS  Prior inferior infarct Confirmed by [name] [phone]) on 05/12/2024 9:11:59 AM    CV Studies: Cardiac studies reviewed are outlined and summarized above. Otherwise please see EMR for full report.   Current Reported Medications:.    Current Meds  Medication Sig   apixaban  (ELIQUIS ) 5 MG TABS tablet Take 1 tablet (5 mg total) by mouth 2 (two) times daily.   atorvastatin  (LIPITOR) 80 MG tablet Take 1 tablet (80 mg total) by mouth daily.   carbidopa -levodopa  (SINEMET  CR) 50-200 MG tablet Take 2 tablets by mouth 2 (two) times daily.   clopidogrel  (PLAVIX ) 75 MG tablet Take 1 tablet (75 mg total) by mouth daily.   nitroGLYCERIN  (NITROSTAT ) 0.4 MG SL tablet Place 1 tablet (0.4 mg total) under the tongue every 5 (five) minutes as needed for chest pain. Do not take within 24 hours of sildenafil    pramipexole  (MIRAPEX ) 1.5 MG tablet Take 1 tablet (1.5 mg total) by mouth 3 (three) times daily.   sildenafil  (REVATIO ) 20 MG  tablet Take 2-5 pills at once, orally, with each sexual encounter   [DISCONTINUED] aspirin  81 MG chewable tablet Chew 1 tablet (81 mg total) by mouth daily.   [DISCONTINUED] losartan  (COZAAR ) 25 MG tablet Take 1 tablet (25 mg total) by mouth daily.   [DISCONTINUED] metoprolol  succinate (TOPROL -XL) 25 MG 24 hr tablet Take 1 tablet (25 mg total) by mouth daily.    Physical Exam:    VS:  BP 134/86   Pulse 65   Ht 5' 11 (1.803 m)    Wt 253 lb (114.8 kg)   SpO2 94%   BMI 35.29 kg/m    Wt Readings from Last 3 Encounters:  05/12/24 253 lb (114.8 kg)  05/01/24 245 lb (111.1 kg)  04/20/24 248 lb (112.5 kg)    GEN: Well nourished, well developed in no acute distress NECK: No JVD; No carotid bruits CARDIAC: RRR with occasional ectopy, no murmurs, rubs, gallops RESPIRATORY:  Clear to auscultation without rales, wheezing or rhonchi  ABDOMEN: Soft, non-tender, non-distended EXTREMITIES:  No edema; No acute deformity. Right radial cath site without hematoma or ecchymosis; good pulse.  Asessement and Plan:.    1. CAD, HLD - doing great post PCI. He confirms he stopped his ASA after 5 days as instructed - will discontinue from med list today. Continue Plavix  75mg  daily + Eliquis  5mg  BID for now. Denies any s/sx of bleeding. I had discussed his case with Dr. Ladona before his visit. Initially there was thought of bringing back at 4 week mark to d/c Eliquis  if no recurrent atrial fib and change to Effient  monotherapy per that discussion. However, he is having a significant amount of ectopy on exam with EKG showing NSR with PACs versus sinus arrhythmia therefore it's unclear if he perhaps could be having a propensity for recurrent silent PAF without awareness. Will plan on 2 week Zio to evaluate further. If no atrial fibrillation seen, will revisit anticoag plan in follow-up, but if recurrent PAF seen, would likely plan on longer duration anticoagulation (and consideration of additional workup such as sleep study). Will get repeat BMET today along with f/u Mg, TSH, CBC. See below re: antihypertensive titration. Already has reminder on rx to avoid SL NTG within 24hr of sildenafil  and vice versa; he confirms awareness of this today. He does not wish to participate in cardiac rehab at this time. Will revisit lipid plan in follow-up OV.  2. Paroxysmal atrial fib (peri-MI) with PACs today - see above regarding monitor and labs. Follow with  titration of metoprolol  to 50mg  daily. Continue apixaban  5mg  BID for now. Denies any cost concerns today.  3. Essential HTN - initial BP 134/86, recheck by me 145/84 - confirms episodic elevated readings at home as well. Will titrate losartan  to 50mg  and Toprol  to 50mg  daily today. (We went over how to double up his 25mg  tablets then transition to the 50mg  tablets.) Recheck BMET in 2 weeks given losartan  titration.  4. Tobacco abuse - congratulated on quitting smoking!  5. Positive cologuard - he was going to be referred to GI then reports this got deferred due to other life commitments. Denies any BRBPR, melena, or any s/sx of GIB. Would continue plan for GI eval but I did tentatively discuss the earliest he could hold antiplatelet therapy with Dr. Ganji and this would be minimum 6 months, contingent on how he does. Would likely defer any non-urgent procedures otherwise to 12 months.    Cardiac Rehabilitation Eligibility Assessment  The patient is ready  to start cardiac rehabilitation from a cardiac standpoint, but he does not wish to participate at this time.    Disposition: F/u with me in 6 weeks.  Signed, Adonte Vanriper N Kirt Chew, PA-C

## 2024-05-12 ENCOUNTER — Ambulatory Visit: Attending: Cardiology | Admitting: Physician Assistant

## 2024-05-12 ENCOUNTER — Encounter: Payer: Self-pay | Admitting: Physician Assistant

## 2024-05-12 ENCOUNTER — Ambulatory Visit

## 2024-05-12 VITALS — BP 145/84 | HR 65 | Ht 71.0 in | Wt 253.0 lb

## 2024-05-12 DIAGNOSIS — I491 Atrial premature depolarization: Secondary | ICD-10-CM

## 2024-05-12 DIAGNOSIS — R195 Other fecal abnormalities: Secondary | ICD-10-CM | POA: Diagnosis not present

## 2024-05-12 DIAGNOSIS — I10 Essential (primary) hypertension: Secondary | ICD-10-CM | POA: Diagnosis not present

## 2024-05-12 DIAGNOSIS — I2510 Atherosclerotic heart disease of native coronary artery without angina pectoris: Secondary | ICD-10-CM

## 2024-05-12 DIAGNOSIS — E785 Hyperlipidemia, unspecified: Secondary | ICD-10-CM | POA: Diagnosis not present

## 2024-05-12 DIAGNOSIS — I480 Paroxysmal atrial fibrillation: Secondary | ICD-10-CM | POA: Diagnosis not present

## 2024-05-12 DIAGNOSIS — Z720 Tobacco use: Secondary | ICD-10-CM | POA: Diagnosis not present

## 2024-05-12 LAB — CBC

## 2024-05-12 MED ORDER — METOPROLOL SUCCINATE ER 50 MG PO TB24: 50.0000 mg | ORAL_TABLET | Freq: Every day | ORAL | 2 refills | Status: DC

## 2024-05-12 MED ORDER — LOSARTAN POTASSIUM 50 MG PO TABS: 50.0000 mg | ORAL_TABLET | Freq: Every day | ORAL | 2 refills | Status: DC

## 2024-05-12 NOTE — Patient Instructions (Addendum)
 Medication Instructions:   START TAKING: TOPROL  XL 50 MG ONCE A DAY   START TAKING: LOSARTAN  50 MG ONCE  A DAY   *If you need a refill on your cardiac medications before your next appointment, please call your pharmacy*   Lab Work:   PLEASE GO DOWN [facility]  FIRST FLOOR   ( GET OFF ELEVATORS WALK TOWARDS WAITING AREA LAB LOCATED BY PHARMACY):  BMET MAG CBC AND TSH TODAY   RETURN IN 2 WEEKS FOR BMET   If you have labs (blood work) drawn today and your tests are completely normal, you will receive your results only by: MyChart Message (if you have MyChart) OR A paper copy in the mail If you have any lab test that is abnormal or we need to change your treatment, we will call you to review the results.   Testing/Procedures: Your physician has recommended that you wear an event monitor. Event monitors are medical devices that record the heart's electrical activity. Doctors most often us  these monitors to diagnose arrhythmias. Arrhythmias are problems with the speed or rhythm of the heartbeat. The monitor is a small, portable device. You can wear one while you do your normal daily activities. This is usually used to diagnose what is causing palpitations/syncope (passing out).     Follow-Up: At [facility], you and your health needs are our priority.  As part of our continuing mission to provide you with exceptional heart care, our providers are all part of one team.  This team includes your primary Cardiologist (physician) and Advanced Practice Providers or APPs (Physician Assistants and Nurse Practitioners) who all work together to provide you with the care you need, when you need it.  Your next appointment:  YOU MAY SEE IN 6 WEEKS DAYNA DUNN PA-C     We recommend signing up for the patient portal called MyChart.  Sign up information is provided on this After Visit Summary.  MyChart is used to connect with patients for Virtual Visits (Telemedicine).  Patients are able to  view lab/test results, encounter notes, upcoming appointments, etc.  Non-urgent messages can be sent to your provider as well.   To learn more about what you can do with MyChart, go to [email or web address].   Other Instructions     ZIO XT- Long Term Monitor Instructions  Your physician has requested you wear a ZIO patch monitor for 14 days.  This is a single patch monitor. [employer] supplies one patch monitor per enrollment. Additional stickers are not available. Please do not apply patch if you will be having a Nuclear Stress Test,  Echocardiogram, Cardiac CT, MRI, or Chest Xray during the period you would be wearing the  monitor. The patch cannot be worn during these tests. You cannot remove and re-apply the  ZIO XT patch monitor.  Your ZIO patch monitor will be mailed 3 day USPS to your address on file. It may take 3-5 days  to receive your monitor after you have been enrolled.  Once you have received your monitor, please review the enclosed instructions. Your monitor  has already been registered assigning a specific monitor serial # to you.  Billing and Patient Assistance Program Information  We have supplied [employer] with any of your insurance information on file for billing purposes. [employer] offers a sliding scale Patient Assistance Program for patients that do not have  insurance, or whose insurance does not completely cover the cost of the ZIO monitor.  You must apply for  the Patient Assistance Program to qualify for this discounted rate.  To apply, please call [employer] at [phone], select option 4, select option 2, ask to apply for  Patient Assistance Program. Meredeth will ask your household income, and how many people  are in your household. They will quote your out-of-pocket cost based on that information.  [employer] will also be able to set up a 16-month, interest-free payment plan if needed.  Applying the monitor   Shave hair from upper left chest.  Hold abrader disc by  orange tab. Rub abrader in 40 strokes over the upper left chest as  indicated in your monitor instructions.  Clean area with 4 enclosed alcohol pads. Let dry.  Apply patch as indicated in monitor instructions. Patch will be placed under collarbone on left  side of chest with arrow pointing upward.  Rub patch adhesive wings for 2 minutes. Remove white label marked 1. Remove the white  label marked 2. Rub patch adhesive wings for 2 additional minutes.  While looking in a mirror, press and release button in center of patch. A small green light will  flash 3-4 times. This will be your only indicator that the monitor has been turned on.  Do not shower for the first 24 hours. You may shower after the first 24 hours.  Press the button if you feel a symptom. You will hear a small click. Record Date, Time and  Symptom in the Patient Logbook.  When you are ready to remove the patch, follow instructions on the last 2 pages of Patient  Logbook. Stick patch monitor onto the last page of Patient Logbook.  Place Patient Logbook in the blue and white box. Use locking tab on box and tape box closed  securely. The blue and white box has prepaid postage on it. Please place it in the mailbox as  soon as possible. Your physician should have your test results approximately 7 days after the  monitor has been mailed back to [facility].  Call [facility] Customer Care at [phone] if you have questions regarding  your ZIO XT patch monitor. Call them immediately if you see an orange light blinking on your  monitor.  If your monitor falls off in less than 4 days, contact our Monitor department at [phone].  If your monitor becomes loose or falls off after 4 days call [employer] at [phone] for  suggestions on securing your monitor

## 2024-05-12 NOTE — Progress Notes (Unsigned)
Enrolled patient for a 14 day Zio XT monitor to be mailed to patients home   Ganji to read

## 2024-05-13 ENCOUNTER — Ambulatory Visit: Payer: Self-pay | Admitting: Physician Assistant

## 2024-05-13 LAB — CBC
Hematocrit: 45.2 (ref 37.5–51.0)
Hemoglobin: 14.8 g/dL (ref 13.0–17.7)
MCH: 29.9 pg (ref 26.6–33.0)
MCHC: 32.7 g/dL (ref 31.5–35.7)
MCV: 91 fL (ref 79–97)
Platelets: 254 10*3/uL (ref 150–450)
RBC: 4.95 x10E6/uL (ref 4.14–5.80)
RDW: 13 (ref 11.6–15.4)
WBC: 12.7 10*3/uL — AB (ref 3.4–10.8)

## 2024-05-13 LAB — BASIC METABOLIC PANEL WITH GFR
BUN/Creatinine Ratio: 15 (ref 10–24)
BUN: 14 mg/dL (ref 8–27)
CO2: 21 mmol/L (ref 20–29)
Calcium: 9 mg/dL (ref 8.6–10.2)
Chloride: 103 mmol/L (ref 96–106)
Creatinine, Ser: 0.96 mg/dL (ref 0.76–1.27)
Glucose: 92 mg/dL (ref 70–99)
Potassium: 4.4 mmol/L (ref 3.5–5.2)
Sodium: 140 mmol/L (ref 134–144)
eGFR: 88 mL/min/{1.73_m2} (ref >59)

## 2024-05-13 LAB — TSH: TSH: 1.08 u[IU]/mL (ref 0.450–4.500)

## 2024-05-13 LAB — MAGNESIUM: Magnesium: 2.1 mg/dL (ref 1.6–2.3)

## 2024-05-25 ENCOUNTER — Other Ambulatory Visit: Payer: Self-pay

## 2024-05-25 DIAGNOSIS — D72829 Elevated white blood cell count, unspecified: Secondary | ICD-10-CM

## 2024-05-31 DIAGNOSIS — M19031 Primary osteoarthritis, right wrist: Secondary | ICD-10-CM | POA: Diagnosis not present

## 2024-05-31 DIAGNOSIS — G5621 Lesion of ulnar nerve, right upper limb: Secondary | ICD-10-CM | POA: Diagnosis not present

## 2024-06-11 ENCOUNTER — Telehealth: Payer: Self-pay | Admitting: Family Medicine

## 2024-06-11 ENCOUNTER — Telehealth: Payer: Self-pay

## 2024-06-11 DIAGNOSIS — M170 Bilateral primary osteoarthritis of knee: Secondary | ICD-10-CM | POA: Diagnosis not present

## 2024-06-11 NOTE — Telephone Encounter (Signed)
...     Pre-operative Risk Assessment    Patient Name: Wesley Davenport  DOB: 03-Sep-1961 MRN: [id number]   Date of last office visit: 05/12/24 Date of next office visit: 07/05/24   Request for Surgical Clearance    Procedure:  LEFT TOTAL KNEE REPLACEMENT  Date of Surgery:  Clearance TBD                                Surgeon:  JULIANE BILLING Surgeon's Group or Practice Name:  [facility] Phone number:  [phone] Fax number:  [phone]   Type of Clearance Requested:   - Medical  - Pharmacy:  Hold Clopidogrel  (Plavix ) and Apixaban  (Eliquis )     Type of Anesthesia:  Not Indicated   Additional requests/questions:    Bonney Teressa Rumalda Ronal   06/11/2024, 11:32 AM

## 2024-06-11 NOTE — Telephone Encounter (Signed)
 Received surgery clearance form from [facility]. I called patient and got him scheduled for clearance with PCP on 07/06/2024.  Will put paperwork in the surgery clearance folder at Nurse station A.

## 2024-06-16 ENCOUNTER — Encounter: Payer: Self-pay | Admitting: Oncology

## 2024-06-16 ENCOUNTER — Other Ambulatory Visit: Payer: Self-pay

## 2024-06-16 ENCOUNTER — Inpatient Hospital Stay

## 2024-06-16 ENCOUNTER — Inpatient Hospital Stay: Attending: Oncology | Admitting: Oncology

## 2024-06-16 VITALS — BP 168/92 | HR 71 | Temp 96.8°F | Resp 17 | Ht 68.75 in | Wt 254.3 lb

## 2024-06-16 DIAGNOSIS — Z87891 Personal history of nicotine dependence: Secondary | ICD-10-CM | POA: Insufficient documentation

## 2024-06-16 DIAGNOSIS — I252 Old myocardial infarction: Secondary | ICD-10-CM | POA: Insufficient documentation

## 2024-06-16 DIAGNOSIS — I2510 Atherosclerotic heart disease of native coronary artery without angina pectoris: Secondary | ICD-10-CM | POA: Diagnosis not present

## 2024-06-16 DIAGNOSIS — E785 Hyperlipidemia, unspecified: Secondary | ICD-10-CM | POA: Diagnosis not present

## 2024-06-16 DIAGNOSIS — Z7902 Long term (current) use of antithrombotics/antiplatelets: Secondary | ICD-10-CM | POA: Insufficient documentation

## 2024-06-16 DIAGNOSIS — I480 Paroxysmal atrial fibrillation: Secondary | ICD-10-CM | POA: Insufficient documentation

## 2024-06-16 DIAGNOSIS — Z79899 Other long term (current) drug therapy: Secondary | ICD-10-CM | POA: Insufficient documentation

## 2024-06-16 DIAGNOSIS — D72829 Elevated white blood cell count, unspecified: Secondary | ICD-10-CM | POA: Diagnosis not present

## 2024-06-16 DIAGNOSIS — Z7901 Long term (current) use of anticoagulants: Secondary | ICD-10-CM | POA: Diagnosis not present

## 2024-06-16 DIAGNOSIS — D72828 Other elevated white blood cell count: Secondary | ICD-10-CM

## 2024-06-16 LAB — CBC WITH DIFFERENTIAL/PLATELET
Abs Immature Granulocytes: 0.04 K/uL (ref 0.00–0.07)
Basophils Absolute: 0.1 K/uL (ref 0.0–0.1)
Basophils Relative: 1 %
Eosinophils Absolute: 0.1 K/uL (ref 0.0–0.5)
Eosinophils Relative: 1 %
HCT: 42.5 % (ref 39.0–52.0)
Hemoglobin: 14.1 g/dL (ref 13.0–17.0)
Immature Granulocytes: 0 %
Lymphocytes Relative: 19 %
Lymphs Abs: 1.9 K/uL (ref 0.7–4.0)
MCH: 30.1 pg (ref 26.0–34.0)
MCHC: 33.2 g/dL (ref 30.0–36.0)
MCV: 90.8 fL (ref 80.0–100.0)
Monocytes Absolute: 0.9 K/uL (ref 0.1–1.0)
Monocytes Relative: 9 %
Neutro Abs: 6.7 K/uL (ref 1.7–7.7)
Neutrophils Relative %: 70 %
Platelets: 226 K/uL (ref 150–400)
RBC: 4.68 MIL/uL (ref 4.22–5.81)
RDW: 14.3 % (ref 11.5–15.5)
WBC: 9.6 K/uL (ref 4.0–10.5)
nRBC: 0 % (ref 0.0–0.2)

## 2024-06-16 NOTE — Progress Notes (Unsigned)
 Hematology/Oncology Consult note [facility] Telephone:([phone] [phone]  Patient Care Team: Zollie Lowers, MD as PCP - General (Family Medicine) Ladona Heinz, MD as PCP - Cardiology (Cardiology)   Name of the patient: Wesley Davenport  [id number]  1961-02-05    Reason for referral-leukocytosis   Referring physician-Dr. Zollie  Date of visit: 06/16/24   History of presenting illness-patient is a 63 year old male with a past medical history significant for CAD, paroxysmal A-fib, hyperlipidemia Referred for leukocytosis.  Labs from 05/12/2024 showed a white cell count of 12.7, H&H of 14.8/45.2 with a platelet count of 254.  Differential was not done at that time.  Looking back at his prior labs patient has always had an elevated white cell count typically between 11-12 at least dating back to 2021.  Hemoglobin and platelet counts have always been normal and differential has mainly shown neutrophilia.  Patient feels well and denies any changes in his appetite or weight.  He has not had any recurrent infections.  He has ongoing left knee pain and will be undergoing surgery for the same soon.  He has a longstanding history of smoking and quit recently.  ECOG PS- 1  Pain scale- 0   Review of systems- Review of Systems  Constitutional:  Negative for chills, fever, malaise/fatigue and weight loss.  HENT:  Negative for congestion, ear discharge and nosebleeds.   Eyes:  Negative for blurred vision.  Respiratory:  Negative for cough, hemoptysis, sputum production, shortness of breath and wheezing.   Cardiovascular:  Negative for chest pain, palpitations, orthopnea and claudication.  Gastrointestinal:  Negative for abdominal pain, blood in stool, constipation, diarrhea, heartburn, melena, nausea and vomiting.  Genitourinary:  Negative for dysuria, flank pain, frequency, hematuria and urgency.  Musculoskeletal:  Negative for back pain, joint pain and myalgias.   Skin:  Negative for rash.  Neurological:  Negative for dizziness, tingling, focal weakness, seizures, weakness and headaches.  Endo/Heme/Allergies:  Does not bruise/bleed easily.  Psychiatric/Behavioral:  Negative for depression and suicidal ideas. The patient does not have insomnia.     No Known Allergies  Patient Active Problem List   Diagnosis Date Noted   CAD (coronary artery disease) 05/02/2024   Acute inferior myocardial infarction (HCC) 05/01/2024   Status post coronary artery stent placement 05/01/2024   Positive colorectal cancer screening using Cologuard test 05/01/2024   Paroxysmal atrial fibrillation (HCC) 05/01/2024   History of tobacco abuse 05/01/2024   Left inguinal hernia 01/17/2022   Chronic pain of both knees 01/17/2022   Hyperlipemia 10/28/2018   Restless leg syndrome, controlled 10/27/2018   Smoker 10/27/2018     Past Medical History:  Diagnosis Date   Hyperlipidemia    Restless leg syndrome      Past Surgical History:  Procedure Laterality Date   CARDIOVERSION N/A 05/01/2024   Procedure: CARDIOVERSION;  Surgeon: Ladona Heinz, MD;  Location: [facility] INVASIVE CV LAB;  Service: Cardiovascular;  Laterality: N/A;   CORONARY/GRAFT ACUTE MI REVASCULARIZATION N/A 05/01/2024   Procedure: Coronary/Graft Acute MI Revascularization;  Surgeon: Ladona Heinz, MD;  Location: [facility] INVASIVE CV LAB;  Service: Cardiovascular;  Laterality: N/A;   HEMORRHOID SURGERY     LEFT HEART CATH AND CORONARY ANGIOGRAPHY N/A 05/01/2024   Procedure: LEFT HEART CATH AND CORONARY ANGIOGRAPHY;  Surgeon: Ladona Heinz, MD;  Location: [facility] INVASIVE CV LAB;  Service: Cardiovascular;  Laterality: N/A;    Social History   Socioeconomic History   Marital status: Married    Spouse name: Not on  file   Number of children: Not on file   Years of education: Not on file   Highest education level: Not on file  Occupational History   Not on file  Tobacco Use   Smoking status: Former    Types: Cigarettes    Smokeless tobacco: Never  Vaping Use   Vaping status: Never Used  Substance and Sexual Activity   Alcohol use: Yes    Comment: occ   Drug use: No   Sexual activity: Not on file  Other Topics Concern   Not on file  Social History Narrative   Not on file   Social Drivers of Health   Financial Resource Strain: Not on file  Food Insecurity: No Food Insecurity (06/16/2024)   Hunger Vital Sign    Worried About Running Out of Food in the Last Year: Never true    Ran Out of Food in the Last Year: Never true  Transportation Needs: No Transportation Needs (05/01/2024)   PRAPARE - Administrator, Civil Service (Medical): No    Lack of Transportation (Non-Medical): No  Physical Activity: Not on file  Stress: Not on file  Social Connections: Moderately Integrated (05/01/2024)   Social Connection and Isolation Panel    Frequency of Communication with Friends and Family: More than three times a week    Frequency of Social Gatherings with Friends and Family: More than three times a week    Attends Religious Services: More than 4 times per year    Active Member of [employer] or Organizations: No    Attends Banker Meetings: Never    Marital Status: Married  Catering manager Violence: Not At Risk (05/01/2024)   Humiliation, Afraid, Rape, and Kick questionnaire    Fear of Current or Ex-Partner: No    Emotionally Abused: No    Physically Abused: No    Sexually Abused: No     Family History  Problem Relation Age of Onset   Cancer Mother    Heart disease Father    Heart disease Brother      Current Outpatient Medications:    atorvastatin  (LIPITOR) 80 MG tablet, Take 1 tablet (80 mg total) by mouth daily., Disp: 30 tablet, Rfl: 11   carbidopa -levodopa  (SINEMET  CR) 50-200 MG tablet, Take 2 tablets by mouth 2 (two) times daily., Disp: 360 tablet, Rfl: 3   clopidogrel  (PLAVIX ) 75 MG tablet, Take 1 tablet (75 mg total) by mouth daily., Disp: 30 tablet, Rfl: 11   losartan   (COZAAR ) 50 MG tablet, Take 1 tablet (50 mg total) by mouth daily., Disp: 90 tablet, Rfl: 2   metoprolol  succinate (TOPROL -XL) 50 MG 24 hr tablet, Take 1 tablet (50 mg total) by mouth daily., Disp: 90 tablet, Rfl: 2   nitroGLYCERIN  (NITROSTAT ) 0.4 MG SL tablet, Place 1 tablet (0.4 mg total) under the tongue every 5 (five) minutes as needed for chest pain. Do not take within 24 hours of sildenafil , Disp: 25 tablet, Rfl: 2   pramipexole  (MIRAPEX ) 1.5 MG tablet, Take 1 tablet (1.5 mg total) by mouth 3 (three) times daily., Disp: 270 tablet, Rfl: 3   sildenafil  (REVATIO ) 20 MG tablet, Take 2-5 pills at once, orally, with each sexual encounter, Disp: 100 tablet, Rfl: 3   apixaban  (ELIQUIS ) 5 MG TABS tablet, Take 1 tablet (5 mg total) by mouth 2 (two) times daily. (Patient not taking: Reported on 06/16/2024), Disp: 60 tablet, Rfl: 2   Physical exam:  Vitals:   06/16/24 1322 06/16/24 1325  BP: (!) 167/97 (!) 168/92  Pulse: 71   Resp: 17   Temp: (!) 96.8 F (36 C)   TempSrc: Tympanic   SpO2: 96%   Weight: 254 lb 4.8 oz (115.3 kg)   Height: 5' 8.75 (1.746 m)    Physical Exam Cardiovascular:     Rate and Rhythm: Normal rate and regular rhythm.     Heart sounds: Normal heart sounds.  Pulmonary:     Effort: Pulmonary effort is normal.     Breath sounds: Normal breath sounds.  Abdominal:     General: Bowel sounds are normal.     Palpations: Abdomen is soft.     Comments: No palpable hepatosplenomegaly  Lymphadenopathy:     Comments: No palpable cervical, supraclavicular, axillary or inguinal adenopathy    Skin:    General: Skin is warm and dry.  Neurological:     Mental Status: He is alert and oriented to person, place, and time.           Latest Ref Rng & Units 05/12/2024   10:14 AM  CMP  Glucose 70 - 99 mg/dL 92   BUN 8 - 27 mg/dL 14   Creatinine 9.23 - 1.27 mg/dL 9.03   Sodium 865 - 855 mmol/L 140   Potassium 3.5 - 5.2 mmol/L 4.4   Chloride 96 - 106 mmol/L 103   CO2 20 - 29  mmol/L 21   Calcium  8.6 - 10.2 mg/dL 9.0    Assessment and plan- Patient is a 63 y.o. male for leukocytosis/neutrophilia  Patient has had longstanding leukocytosis/neutrophilia with a white cell count that fluctuates between 10-12 at least since 2021.  Differential is mainly shown neutrophilia.  Hemoglobin and platelets are normal.  This is likely reactive and can be seen in the setting of chronic smoking as well as nonspecific inflammation.  In the absence of other cytopenias this does not raise overt concern for primary bone marrow disorder.  As such patient does not require bone marrow biopsy.  I will be getting CBC with differential, flow cytometry and BCR-ABL testing today.  Video visit with me in 2 weeks time   Thank you for this kind referral and the opportunity to participate in the care of this patient   Visit Diagnosis 1. Neutrophilia     Dr. Annah Skene, MD, MPH [facility] at [facility] [phone] 06/16/2024

## 2024-06-16 NOTE — Progress Notes (Unsigned)
 Ran out of eliquis .  Chronic bilateral knee pain, left knee worse; just came from the doctor; they're hoping to do surgery in 6 weeks. Sees PCP tomorrow. Denies pain while sitting; 7/8 with activity.  Heart attack 05/01/24. xrays on both knees last week. X-ray wrist the week before, also had a cortisone shot; determined it was arthritis.  numbness / tingling to right hand; left hand comes and goes.

## 2024-06-17 ENCOUNTER — Ambulatory Visit (INDEPENDENT_AMBULATORY_CARE_PROVIDER_SITE_OTHER): Admitting: Family Medicine

## 2024-06-17 ENCOUNTER — Encounter: Payer: Self-pay | Admitting: Family Medicine

## 2024-06-17 VITALS — BP 122/67 | HR 70 | Ht 70.5 in | Wt 254.6 lb

## 2024-06-17 DIAGNOSIS — I25118 Atherosclerotic heart disease of native coronary artery with other forms of angina pectoris: Secondary | ICD-10-CM | POA: Diagnosis not present

## 2024-06-17 NOTE — Patient Instructions (Signed)
 Contact Dr. Zenaida for cardiac clearance. He may not be able to clear you due to the recent heat attack and stent. He might be willing to approve a Lovenox bridge.

## 2024-06-17 NOTE — Progress Notes (Signed)
 Subjective:  Patient ID: Wesley Davenport, male    DOB: 1961/02/26  Age: 63 y.o. MRN: [id number]  CC: SURGERY CLEARANCE   HPI Wesley Davenport presents for surgical clearance.  Had STEMI and subsequent coronary stent followed by a. Fib with successful cardioversion on June 21.  In today for surgical clearance for total knee replacement.  He is currently taking clopidogrel .  He could not afford the Eliquis .     06/17/2024    2:14 PM 06/16/2024    1:25 PM 07/03/2023   10:36 AM  Depression screen PHQ 2/9  Decreased Interest 0 0 0  Down, Depressed, Hopeless 0 0 0  PHQ - 2 Score 0 0 0  Altered sleeping   3  Tired, decreased energy   0  Change in appetite   0  Feeling bad or failure about yourself    0  Trouble concentrating   0  Moving slowly or fidgety/restless   0  Suicidal thoughts   0  PHQ-9 Score   3  Difficult doing work/chores   Somewhat difficult    History Wesley Davenport has a past medical history of Hyperlipidemia and Restless leg syndrome.   He has a past surgical history that includes Hemorrhoid surgery; Coronary/Graft Acute MI Revascularization (N/A, 05/01/2024); LEFT HEART CATH AND CORONARY ANGIOGRAPHY (N/A, 05/01/2024); and CARDIOVERSION (N/A, 05/01/2024).   His family history includes Cancer in his mother; Heart disease in his brother and father.He reports that he has quit smoking. His smoking use included cigarettes. He has never used smokeless tobacco. He reports current alcohol use. He reports that he does not use drugs.    ROS Review of Systems  Constitutional:  Negative for fever.  Respiratory:  Negative for shortness of breath.   Cardiovascular:  Negative for chest pain.  Musculoskeletal:  Negative for arthralgias.  Skin:  Negative for rash.    Objective:  BP 122/67   Pulse 70   Ht 5' 10.5 (1.791 m)   Wt 254 lb 9.6 oz (115.5 kg)   SpO2 94%   BMI 36.01 kg/m   BP Readings from Last 3 Encounters:  06/17/24 122/67  06/16/24 (!) 168/92  05/12/24 (!) 145/84    Wt  Readings from Last 3 Encounters:  06/17/24 254 lb 9.6 oz (115.5 kg)  06/16/24 254 lb 4.8 oz (115.3 kg)  05/12/24 253 lb (114.8 kg)     Physical Exam Vitals reviewed.  Constitutional:      Appearance: He is well-developed.  HENT:     Head: Normocephalic and atraumatic.     Right Ear: External ear normal.     Left Ear: External ear normal.     Mouth/Throat:     Pharynx: No oropharyngeal exudate or posterior oropharyngeal erythema.  Eyes:     Pupils: Pupils are equal, round, and reactive to light.  Cardiovascular:     Rate and Rhythm: Normal rate and regular rhythm.     Heart sounds: No murmur heard. Pulmonary:     Effort: No respiratory distress.     Breath sounds: Normal breath sounds.  Musculoskeletal:     Cervical back: Normal range of motion and neck supple.  Neurological:     Mental Status: He is alert and oriented to person, place, and time.      Assessment & Plan:  Coronary artery disease of native artery of native heart with stable angina pectoris (HCC)  I exp had if he can get clearance through him then the surgery can go forward.  Lained  to the patient that medically speaking without regard for his cardiac condition he was cleared for surgery.  However due to the recent stent placement at time of STEMI I could not clear him for his cardiac condition.  In fact he may need to wait up to a 4-year before his Plavix  can be held.  I asked him to set up a time for consultation with his cardiologist to perform the procedures and he is agreeable to do that.   Follow-up: Return in about 3 months (around 09/17/2024).  Butler Der, M.D.

## 2024-06-19 LAB — COMP PANEL: LEUKEMIA/LYMPHOMA

## 2024-06-20 ENCOUNTER — Encounter: Payer: Self-pay | Admitting: Family Medicine

## 2024-06-23 LAB — BCR-ABL1 FISH
Cells Analyzed: 200
Cells Counted: 200

## 2024-06-24 NOTE — Telephone Encounter (Signed)
 Patient with diagnosis of atrial fibrillation on Eliquis  for anticoagulation.    Procedure:  LEFT TOTAL KNEE REPLACEMENT   Date of Surgery:  Clearance TBD     CHA2DS2-VASc Score = 3   This indicates a 3.2% annual risk of stroke. The patient's score is based upon: CHF History: 0 HTN History: 1 Diabetes History: 0 Stroke History: 0 Vascular Disease History: 1 Age Score: 1 Gender Score: 0   CrCl 125 Platelet count 226  Patient has not had an Afib/aflutter ablation within the last 3 months or DCCV within the last 30 days  (DCCV 05/01/24)  Per office protocol, patient can hold Eliquis  for 3 days prior to procedure.   Patient will not need bridging with Lovenox (enoxaparin) around procedure.  **This guidance is not considered finalized until pre-operative APP has relayed final recommendations.**

## 2024-06-24 NOTE — Telephone Encounter (Signed)
   Name: Tyreece Gelles  DOB: 18-Oct-1961  MRN: [id number]  Primary Cardiologist: Gordy Bergamo, MD  Chart reviewed as part of pre-operative protocol coverage. The patient has an upcoming visit scheduled with Dayna Dunn, PA on 07/05/2024 at which time clearance can be addressed in case there are any issues that would impact surgical recommendations.  Total knee replacement is not scheduled until TBD as below. I added preop FYI to appointment note so that provider is aware to address at time of outpatient visit.  Per office protocol the cardiology provider should forward their finalized clearance decision and recommendations regarding antiplatelet therapy to the requesting party below.    Per office protocol, patient can hold Eliquis  for 3 days prior to procedure.    I will route this message as FYI to requesting party and remove this message from the preop box as separate preop APP input not needed at this time.   Please call with any questions.  Damien JAYSON Braver, NP  06/24/2024, 11:55 AM

## 2024-06-29 ENCOUNTER — Encounter (HOSPITAL_BASED_OUTPATIENT_CLINIC_OR_DEPARTMENT_OTHER): Payer: Self-pay | Admitting: Family

## 2024-06-29 ENCOUNTER — Ambulatory Visit (INDEPENDENT_AMBULATORY_CARE_PROVIDER_SITE_OTHER): Admitting: Family

## 2024-06-29 VITALS — BP 124/70 | HR 68 | Ht 68.5 in | Wt 253.6 lb

## 2024-06-29 DIAGNOSIS — E785 Hyperlipidemia, unspecified: Secondary | ICD-10-CM | POA: Diagnosis not present

## 2024-06-29 DIAGNOSIS — I480 Paroxysmal atrial fibrillation: Secondary | ICD-10-CM

## 2024-06-29 DIAGNOSIS — I10 Essential (primary) hypertension: Secondary | ICD-10-CM | POA: Diagnosis not present

## 2024-06-29 DIAGNOSIS — I2510 Atherosclerotic heart disease of native coronary artery without angina pectoris: Secondary | ICD-10-CM | POA: Diagnosis not present

## 2024-06-29 MED ORDER — DABIGATRAN ETEXILATE MESYLATE 150 MG PO CAPS: 150.0000 mg | ORAL_CAPSULE | Freq: Two times a day (BID) | ORAL | 1 refills | Status: DC

## 2024-06-29 NOTE — Patient Instructions (Addendum)
 Medication Instructions:   For as needed Nitroglycerin , if you develop chest pain: Sit and rest 5 minutes. If chest pain does not resolve place 1 nitroglycerin  under your tongue and wait 5 minutes. If chest pain does not resolve, place a 2nd nitroglycerin  under your tongue and wait 5 more minutes. If chest pain does not resolve, place a 3rd nitroglycerin  under your tongue and seek emergency services.    Can try Famotidine (Pepcid) as needed for longer lasting acid reflux medication  STOP Eliquis   START Pradaxa  150mg  twice per week  *If you need a refill on your cardiac medications before your next appointment, please call your pharmacy*  Lab Work:  Your physician recommends that you return for lab work in:  direct LDL, liver function panel If you have labs (blood work) drawn today and your tests are completely normal, you will receive your results only by: MyChart Message (if you have MyChart) OR A paper copy in the mail If you have any lab test that is abnormal or we need to change your treatment, we will call you to review the results.  Testing/Procedures: Please wear monitor for 2 weeks.   Follow-Up: At [facility], you and your health needs are our priority.  As part of our continuing mission to provide you with exceptional heart care, our providers are all part of one team.  This team includes your primary Cardiologist (physician) and Advanced Practice Providers or APPs (Physician Assistants and Nurse Practitioners) who all work together to provide you with the care you need, when you need it.  Your next appointment:   2-3 months with Dr. Ladona  We recommend signing up for the patient portal called MyChart.  Sign up information is provided on this After Visit Summary.  MyChart is used to connect with patients for Virtual Visits (Telemedicine).  Patients are able to view lab/test results, encounter notes, upcoming appointments, etc.  Non-urgent messages can be sent to  your provider as well.   To learn more about what you can do with MyChart, go to [email or web address].   Other Instructions  We recommend waiting 6 months after stenting for elective surgeries

## 2024-06-29 NOTE — Progress Notes (Signed)
 Cardiology Office Note   Date:  06/29/2024  ID:  Wesley Davenport, DOB [birth year], MRN [id number] PCP: Wesley Lowers, MD  [facility] Providers Cardiologist:  Wesley Bergamo, MD     History of Present Illness Wesley Davenport is a 63 y.o. male with hx of CAD s/p STEMP 04/2024 DES-RCA, PAF, HLD, prediabetes, RLS, ED, chronic knee pain, prior tobacco use, leukocytosis.   04/2024 admitted with STEMI with PTCA and stenting of prox RCA with DES. Aspirin  and prasugrel  10mg  daily for a minimum of 12 months. Echo LVEF 55-60%, hypokinesis inferior wall, gr1dd. Developed AFib RVR during cath and required DCCV. Amiodarone  stopped dye to bradycardia. He was discharged on Eliquis . Summary indicated Eliquis  potentially be stopped after 4 weeks given occurred in setting of STEMI.  Last seen 05/12/24. Was recommended for 2 week ZIO to assess atrial fib burden given EKG with PAC vs sinus arrhythmia. Losartan  and Toprol  doses increased for hypertension.  Presents today for follow up with his wife for clearance for knee surgery. Per prior notes the earliest he could hold antiplatelet therapy per Dr. Bergamo and this would be minimum 6 months. He has ZIO monitor at home, did not wear. He is not taking Eliquis  as reports was cost prohibitive, discussed prescription deductible with his present insurance plan. Reports no shortness of breath nor dyspnea on exertion. Reports no chest pain, pressure, or tightness. No edema, orthopnea, PND. Reports no palpitations.  Activity tolerance limited by knee pain.  ROS: Please see the history of present illness.    All other systems reviewed and are negative.   Studies Reviewed      Cardiac Studies & Procedures   ______________________________________________________________________________________________ CARDIAC CATHETERIZATION  CARDIAC CATHETERIZATION 05/01/2024  Conclusion Images from the original result were not included.    Prox RCA-1 lesion is 30% stenosed.  Cardiac  Catheterization 05/01/24: Hemodynamic data: LVEDP 12 mmHg.  There is no pressure gradient across the aortic valve.  Angiographic data: LM: Large-caliber vessel, smooth and normal. LAD: Moderate large-caliber vessel with mild diffuse disease and in the proximal segment diffuse 40% stenosis followed by a tandem 30% stenosis.  Gives origin to large D1 with secondary branch which also has mild diffuse disease. CX: Very large caliber vessel, appears codominant with RCA.  OM1 with a soft plaque 40% stenosis, tiny OM 2 and a large OM 3.  There is a 60% stenosis between OM 2 and 3 in the mid CX. RCA: Large-caliber vessel.  Proximal segment has a 99% stenosis which is ulcerated.  Intervention data: Successful PTCA and stenting of the proximal RCA with implantation of a 3.5 x 20 mm Synergy XD DES, stenosis reduced from 99% to 0% with TIMI III to TIMI-3 flow.   Impression and recommendations: Patient received direct-current cardioversion x 1 at 100 J but persisted in atrial fibrillation that started during PCI.  Patient was given bolus of amiodarone  150 mg and IV drip started if he persistent A-fib, consider cardioversion this morning, heparin  drip has been ordered as well.  Patient was started on aspirin  along with prasugrel .  Findings Coronary Findings Diagnostic  Dominance: Right  Left Anterior Descending There is mild diffuse disease throughout the vessel. Prox LAD to Mid LAD lesion is 30% stenosed. Mid LAD lesion is 30% stenosed.  First Diagonal Branch There is mild disease in the vessel.  Left Circumflex Dist Cx lesion is 60% stenosed.  First Obtuse Marginal Branch 1st Mrg lesion is 40% stenosed.  Right Coronary Artery Prox RCA-1 lesion is 30%  stenosed. Prox RCA-2 lesion is 99% stenosed. The lesion is type C and ulcerative. Dist RCA lesion is 30% stenosed.  Intervention  Prox RCA-2 lesion Stent Lesion length:  20 mm. CATH LAUNCHER [id number] guide catheter was inserted. Lesion  crossed with guidewire using a WIRE RUNTHROUGH .[id number]. Pre-stent angioplasty was performed using a BALLOON EMERGE MR 3.0X12. Maximum pressure:  16 atm. Inflation time:  45 sec. A drug-eluting stent was successfully placed using a STENT SYNERGY XD 3.50X20. Maximum pressure: 16 atm. Inflation time: 40 sec. Stent strut is well apposed. Post-stent angioplasty was not performed. Post-Intervention Lesion Assessment The intervention was successful. Pre-interventional TIMI flow is 3. Post-intervention TIMI flow is 3. No complications occurred at this lesion. There is a 0% residual stenosis post intervention.     ECHOCARDIOGRAM  ECHOCARDIOGRAM COMPLETE 05/02/2024  Narrative ECHOCARDIOGRAM REPORT    Patient Name:   Wesley Davenport Date of Exam: 05/02/2024 Medical Rec #:  [id number]     Height:       72.0 in Accession #:    [id number]    Weight:       245.0 lb Date of Birth:  Oct 15, 1961     BSA:          2.322 m Patient Age:    63 years      BP:           169/91 mmHg Patient Gender: M             HR:           58 bpm. Exam Location:  Inpatient  Procedure: 2D Echo, Color Doppler and Cardiac Doppler (Both Spectral and Color Flow Doppler were utilized during procedure).  Indications:    Acute inferior myocardial infarction  History:        Patient has no prior history of Echocardiogram examinations.  Sonographer:    Wesley Davenport Referring Phys: [id number] Wesley Davenport  IMPRESSIONS   1. Left ventricular ejection fraction, by estimation, is 55 to 60%. The left ventricle has normal function. The left ventricle demonstrates regional wall motion abnormalities (see scoring diagram/findings for description). There is mild left ventricular hypertrophy. Left ventricular diastolic parameters are consistent with Grade I diastolic dysfunction (impaired relaxation). 2. Right ventricular systolic function is normal. The right ventricular size is normal. There is normal pulmonary artery systolic pressure. The  estimated right ventricular systolic pressure is 23.1 mmHg. 3. The mitral valve is normal in structure. No evidence of mitral valve regurgitation. No evidence of mitral stenosis. 4. The aortic valve is normal in structure. Aortic valve regurgitation is not visualized. No aortic stenosis is present. 5. The inferior vena cava is dilated in size with >50% respiratory variability, suggesting right atrial pressure of 8 mmHg.  FINDINGS Left Ventricle: Left ventricular ejection fraction, by estimation, is 55 to 60%. The left ventricle has normal function. The left ventricle demonstrates regional wall motion abnormalities. Definity  contrast agent was given IV to delineate the left ventricular endocardial borders. The left ventricular internal cavity size was normal in size. There is mild left ventricular hypertrophy. Left ventricular diastolic parameters are consistent with Grade I diastolic dysfunction (impaired relaxation).   LV Wall Scoring: The inferior wall is hypokinetic.  Right Ventricle: The right ventricular size is normal. No increase in right ventricular wall thickness. Right ventricular systolic function is normal. There is normal pulmonary artery systolic pressure. The tricuspid regurgitant velocity is 1.94 m/s, and with an assumed right atrial pressure of 8 mmHg, the estimated right ventricular systolic  pressure is 23.1 mmHg.  Left Atrium: Left atrial size was normal in size.  Right Atrium: Right atrial size was normal in size.  Pericardium: There is no evidence of pericardial effusion.  Mitral Valve: The mitral valve is normal in structure. No evidence of mitral valve regurgitation. No evidence of mitral valve stenosis.  Tricuspid Valve: The tricuspid valve is normal in structure. Tricuspid valve regurgitation is trivial. No evidence of tricuspid stenosis.  Aortic Valve: The aortic valve is normal in structure. Aortic valve regurgitation is not visualized. No aortic stenosis is  present. Aortic valve mean gradient measures 4.0 mmHg. Aortic valve peak gradient measures 8.6 mmHg. Aortic valve area, by VTI measures 2.93 cm.  Pulmonic Valve: The pulmonic valve was normal in structure. Pulmonic valve regurgitation is not visualized. No evidence of pulmonic stenosis.  Aorta: The aortic root is normal in size and structure.  Venous: The inferior vena cava is dilated in size with greater than 50% respiratory variability, suggesting right atrial pressure of 8 mmHg.  IAS/Shunts: No atrial level shunt detected by color flow Doppler.   LEFT VENTRICLE PLAX 2D LVIDd:         5.50 cm      Diastology LVIDs:         4.10 cm      LV e' medial:    6.64 cm/s LV PW:         1.30 cm      LV E/e' medial:  11.0 LV IVS:        1.00 cm      LV e' lateral:   6.20 cm/s LVOT diam:     2.40 cm      LV E/e' lateral: 11.7 LV SV:         88 LV SV Index:   38 LVOT Area:     4.52 cm  LV Volumes (MOD) LV vol d, MOD A2C: 123.0 ml LV vol d, MOD A4C: 131.0 ml LV vol s, MOD A2C: 53.0 ml LV vol s, MOD A4C: 48.4 ml LV SV MOD A2C:     70.0 ml LV SV MOD A4C:     131.0 ml LV SV MOD BP:      76.7 ml  RIGHT VENTRICLE RV S prime:     10.70 cm/s TAPSE (M-mode): 1.8 cm  LEFT ATRIUM             Index LA diam:        4.10 cm 1.77 cm/m LA Vol (A2C):   45.1 ml 19.42 ml/m LA Vol (A4C):   42.6 ml 18.34 ml/m LA Biplane Vol: 45.1 ml 19.42 ml/m AORTIC VALVE AV Area (Vmax):    3.26 cm AV Area (Vmean):   2.96 cm AV Area (VTI):     2.93 cm AV Vmax:           147.00 cm/s AV Vmean:          88.600 cm/s AV VTI:            0.301 m AV Peak Grad:      8.6 mmHg AV Mean Grad:      4.0 mmHg LVOT Vmax:         106.00 cm/s LVOT Vmean:        57.900 cm/s LVOT VTI:          0.195 m LVOT/AV VTI ratio: 0.65  AORTA Ao Asc diam: 3.20 cm  MITRAL VALVE  TRICUSPID VALVE MV Area (PHT): 3.37 cm    TR Peak grad:   15.1 mmHg MV Decel Time: 225 msec    TR Vmax:        194.00 cm/s MV E  velocity: 72.80 cm/s MV A velocity: 73.40 cm/s  SHUNTS MV E/A ratio:  0.99        Systemic VTI:  0.20 m Systemic Diam: 2.40 cm  Wesley Parchment MD Electronically signed by Wesley Parchment MD Signature Date/Time: 05/02/2024/11:13:35 AM    Final          ______________________________________________________________________________________________      Risk Assessment/Calculations  CHA2DS2-VASc Score = 3   This indicates a 3.2% annual risk of stroke. The patient's score is based upon: CHF History: 0 HTN History: 1 Diabetes History: 0 Stroke History: 0 Vascular Disease History: 1 Age Score: 1 Gender Score: 0            Physical Exam VS:  BP 124/70   Pulse 68   Ht 5' 8.5 (1.74 m)   Wt 253 lb 9.6 oz (115 kg)   SpO2 94%   BMI 38.00 kg/m        Wt Readings from Last 3 Encounters:  06/29/24 253 lb 9.6 oz (115 kg)  06/17/24 254 lb 9.6 oz (115.5 kg)  06/16/24 254 lb 4.8 oz (115.3 kg)    GEN: Well nourished, well developed in no acute distress NECK: No JVD; No carotid bruits CARDIAC: RRR, no murmurs, rubs, gallops RESPIRATORY:  Clear to auscultation without rales, wheezing or rhonchi  ABDOMEN: Soft, non-tender, non-distended EXTREMITIES:  No edema; No deformity   ASSESSMENT AND PLAN   Assessment & Plan Coronary artery disease with prior myocardial infarction and stent placement No current chest pain. Educated on differentiating indigestion from cardiac pain. -GDMT atorvastatin  80mg  daily, plavix  75g daily, toprol  50mg  daily. -Recommend aiming for 150 minutes of moderate intensity activity per week and following a heart healthy diet.   - Educated on nitroglycerin  use for chest discomfort. - Advised Tums for indigestion, nitroglycerin  if discomfort persists. - Instructed to call 911 if discomfort persists after three nitroglycerin  tablets. - Delay elective surgeries for at least six months post-stent placement.  Paroxysmal atrial fibrillation, monitoring for  recurrence and anticoagulation management EKG today NSR with PAC. DId not complete prior ZIO monitor to assess atrial fib burden though still has at home. Concernt o under-sense recurrent PAF due to propensity towards ectopy. Has not taken Eliquis  in some time due to cost. Reviewed stroke risk of atrial fib. - Stop Eliquis . Prescribe Pradaxa  150mg  BID with a coupon for cost management. - Instruct to wear a ZIO monitor for AFib recurrence for up to two weeks. - Educated on the importance of monitoring for AFib recurrence.  Hypertension Blood pressure goal is less than 130/80 mmHg. Recent readings slightly elevated at home but 124/70 mmHg in office. Current medications include losartan  and metoprolol , which are effective. - Continue current antihypertensive regimen Losartan  50mg  daily, Toprol  50mg  daily.  - Advise to bring home blood pressure cuff to next appointment for calibration.  Hyperlipidemia LDL cholesterol was 150 mg/dL in hospital, target is less than 55 mg/dL. Lowered goal due to hx of STEMI and concurrent risk of HTN, >65 you. Currently on atorvastatin . - Continue atorvastatin  80mg  daily - Provide dietary recommendations for cholesterol management. -Direct LDL, CMET today.  Severe bilateral knee osteoarthritis, pending total knee replacement Severe bilateral knee osteoarthritis with significant pain and limited mobility.  - Delay elective knee surgery  for at least six months post-stent placement (STEMI and stent placed 05/01/24, earliest elective surgery date 10/31/24). Surgical clearance is not provided.         Dispo: follow up in 2-3 months with Dr. Ladona  Signed, Reche GORMAN Finder, NP

## 2024-06-30 ENCOUNTER — Telehealth: Payer: Self-pay | Admitting: Pharmacy Technician

## 2024-06-30 ENCOUNTER — Other Ambulatory Visit (HOSPITAL_COMMUNITY): Payer: Self-pay

## 2024-06-30 NOTE — Telephone Encounter (Signed)
   Received this from [facility] [phone] but last note said eliquis  was too expensive and she was prescribing this with a coupon for co

## 2024-06-30 NOTE — Telephone Encounter (Signed)
 Patients wife called to cancel tomorrows video visit- she stated he didn't want to keep the visit. He will call to reschedule if he needs to see her.   Appointment cancelled as requested

## 2024-06-30 NOTE — Telephone Encounter (Signed)
 I called [facility] and gave them the coupon that was attached to rx and now it is 65.52 for 30 days per [facility]

## 2024-07-01 ENCOUNTER — Inpatient Hospital Stay: Admitting: Oncology

## 2024-07-05 ENCOUNTER — Ambulatory Visit: Admitting: Physician Assistant

## 2024-07-06 ENCOUNTER — Ambulatory Visit: Admitting: Family Medicine

## 2024-08-25 ENCOUNTER — Ambulatory Visit: Attending: Cardiology

## 2024-08-25 ENCOUNTER — Ambulatory Visit: Attending: Cardiovascular Disease | Admitting: Cardiology

## 2024-08-25 ENCOUNTER — Encounter: Payer: Self-pay | Admitting: Cardiology

## 2024-08-25 VITALS — BP 153/89 | HR 67 | Resp 16 | Ht 68.0 in | Wt 264.8 lb

## 2024-08-25 DIAGNOSIS — I10 Essential (primary) hypertension: Secondary | ICD-10-CM

## 2024-08-25 DIAGNOSIS — E785 Hyperlipidemia, unspecified: Secondary | ICD-10-CM

## 2024-08-25 DIAGNOSIS — I2510 Atherosclerotic heart disease of native coronary artery without angina pectoris: Secondary | ICD-10-CM | POA: Diagnosis not present

## 2024-08-25 DIAGNOSIS — I480 Paroxysmal atrial fibrillation: Secondary | ICD-10-CM

## 2024-08-25 MED ORDER — ASPIRIN 81 MG PO CHEW: 81.0000 mg | CHEWABLE_TABLET | Freq: Every day | ORAL | Status: AC

## 2024-08-25 MED ORDER — PRASUGREL HCL 10 MG PO TABS: 10.0000 mg | ORAL_TABLET | Freq: Every day | ORAL | 3 refills | Status: AC

## 2024-08-25 MED ORDER — OLMESARTAN MEDOXOMIL-HCTZ 40-12.5 MG PO TABS
1.0000 | ORAL_TABLET | Freq: Every day | ORAL | 3 refills | Status: AC
Start: 2024-08-25 — End: ?

## 2024-08-25 MED ORDER — AMLODIPINE BESYLATE 5 MG PO TABS: 5.0000 mg | ORAL_TABLET | Freq: Every day | ORAL | 2 refills | Status: DC

## 2024-08-25 NOTE — Patient Instructions (Addendum)
 Medication Instructions:  START Amlodipine - Take 1 tablet (5 mg total) by mouth daily  START Aspirin  81 mg chewable tablet - Chew 1 tablet (81 mg total) by mouth daily.  START Olmesartan-hydrochlorothiazide (Benicar HCT) 40-12.5 mg - Take 1 tablet by mouth daily.  START Effient  10 mg - Take 1 tablet (10 mg total) by mouth daily.   STOP Plavix  STOP Pradaxa  STOP Losartan   *If you need a refill on your cardiac medications before your next appointment, please call your pharmacy*  Lab Work: Lipid Panel today If you have labs (blood work) drawn today and your tests are completely normal, you will receive your results only by: MyChart Message (if you have MyChart) OR A paper copy in the mail If you have any lab test that is abnormal or we need to change your treatment, we will call you to review the results.  Testing/Procedures: 2 Week zio heart monitor  Your physician has requested that you wear a Zio heart monitor for 14 days. This will be mailed to your home with instructions on how to apply the monitor and how to return it when finished. Please allow 2 weeks after returning the heart monitor before our office calls you with the results.    Follow-Up: At [facility], you and your health needs are our priority.  As part of our continuing mission to provide you with exceptional heart care, our providers are all part of one team.  This team includes your primary Cardiologist (physician) and Advanced Practice Providers or APPs (Physician Assistants and Nurse Practitioners) who all work together to provide you with the care you need, when you need it.  Your next appointment:   3 Months   Provider:   Gordy Bergamo, MD    We recommend signing up for the patient portal called MyChart.  Sign up information is provided on this After Visit Summary.  MyChart is used to connect with patients for Virtual Visits (Telemedicine).  Patients are able to view lab/test results, encounter notes,  upcoming appointments, etc.  Non-urgent messages can be sent to your provider as well.   To learn more about what you can do with MyChart, go to [email or web address].       ZIO XT- Long Term Monitor Instructions  Your physician has requested you wear a ZIO patch monitor for 14 days.   This is a single patch monitor. [employer] supplies one patch monitor per enrollment. Additional  stickers are not available. Please do not apply patch if you will be having a Nuclear Stress Test,  Echocardiogram, Cardiac CT, MRI, or Chest Xray during the period you would be wearing the  monitor. The patch cannot be worn during these tests. You cannot remove and re-apply the  ZIO XT patch monitor.   Your ZIO patch monitor will be mailed 3 day USPS to your address on file. It may take 3-5 days  to receive your monitor after you have been enrolled.   Once you have received your monitor, please review the enclosed instructions. Your monitor  has already been registered assigning a specific monitor serial # to you.     Billing and Patient Assistance Program Information  We have supplied [employer] with any of your insurance information on file for billing purposes.  [employer] offers a sliding scale Patient Assistance Program for patients that do not have  insurance, or whose insurance does not completely cover the cost of the ZIO monitor.  You must apply for the Patient Assistance Program  to qualify for this discounted rate.   To apply, please call [employer] at [phone], select option 4, select option 2, ask to apply for  Patient Assistance Program. Meredeth will ask your household income, and how many people  are in your household. They will quote your out-of-pocket cost based on that information.  [employer] will also be able to set up a 66-month, interest-free payment plan if needed.     Applying the monitor  Shave hair from upper left chest.  Hold abrader disc by orange tab. Rub abrader in 40 strokes over the  upper left chest as  indicated in your monitor instructions.  Clean area with 4 enclosed alcohol pads. Let dry.  Apply patch as indicated in monitor instructions. Patch will be placed under collarbone on left  side of chest with arrow pointing upward.  Rub patch adhesive wings for 2 minutes. Remove white label marked 1. Remove the white  label marked 2. Rub patch adhesive wings for 2 additional minutes.  While looking in a mirror, press and release button in center of patch. A small green light will  flash 3-4 times. This will be your only indicator that the monitor has been turned on.  Do not shower for the first 24 hours. You may shower after the first 24 hours.  Press the button if you feel a symptom. You will hear a small click. Record Date, Time and  Symptom in the Patient Logbook.  When you are ready to remove the patch, follow instructions on the last 2 pages of Patient  Logbook. Stick patch monitor onto the last page of Patient Logbook.   Place Patient Logbook in the blue and white box. Use locking tab on box and tape box closed  securely. The blue and white box has prepaid postage on it. Please place it in the mailbox as  soon as possible. Your physician should have your test results approximately 7 days after the  monitor has been mailed back to [facility].   Call [facility] Customer Care at [phone] if you have questions regarding  your ZIO XT patch monitor. Call them immediately if you see an orange light blinking on your  monitor.   If your monitor falls off in less than 4 days, contact our Monitor department at [phone].   If your monitor becomes loose or falls off after 4 days call [employer] at [phone] for  suggestions on securing your monitor.

## 2024-08-25 NOTE — Progress Notes (Unsigned)
 ZIO serial # [id number] from office inventory applied to patient.

## 2024-08-25 NOTE — Progress Notes (Addendum)
 " Cardiology Office Note:  .   Date:  08/26/2024  ID:  Wesley Davenport, DOB [birth year], MRN [id number] PCP: Zollie Lowers, MD  [facility] Providers Cardiologist:  Gordy Bergamo, MD   History of Present Illness: .   Wesley Davenport is a 63 y.o. Caucasian male patient presenting with inferior STEMI on 05/01/24 SP proximal RCA DES, cardioversion for atrial fibrillation with RVR during PCI but persisted with A-fib hence was started on amiodarone  and underwent successful cardioversion on in the ICU the same day discharged on Eliquis  with plans on stopping anticoagulation if there is no recurrence of A-fib on event monitor.  This is a 72-month office visit.  He quit smoking cigarettes 1 week prior to his STEMI and also has pure hypercholesterolemia.  He was seen in our clinic on 06/29/2024 and Eliquis  was switched to Pradaxa  in view of insurance needs however patient discontinued taking anticoagulation greater than a month ago.  Presently only on Plavix .  He is accompanied by his wife and states that he is doing well and remains asymptomatic except for severe arthritis and is eagerly waiting to proceed with left knee replacement.  He was unable to wear a event monitor as it kept coming off the skin.  Cardiac Studies relevent.    Cardiac Catheterization 05/01/24:   proximal RCA 3.5 x 20 mm Synergy XD DES     ECHOCARDIOGRAM COMPLETE 05/02/2024  Normal LV systolic function, EF 55 to 60% with inferior wall hypokinesis. No significant valvular abnormality.    Discussed the use of AI scribe software for clinical note transcription with the patient, who gave verbal consent to proceed.  History of Present Illness Wesley Davenport is a 63 year old male with a history of myocardial infarction and atrial fibrillation who presents for follow-up regarding his cardiac health and medication management.  He has undergone heart catheterization, amiodarone  therapy, and cardioversion to maintain sinus rhythm. He  struggles with keeping a monitor for atrial fibrillation due to sweating and activity. He is not taking Pradaxa  due to affordability and uses clopidogrel  instead, avoiding aspirin  and Effient  for similar reasons.  He faces challenges with weight management despite dietary changes, including reducing bread and cereal and opting for healthier snacks like cauliflower and vegetables. He continues to gain weight, which he attributes partly to nighttime snacking due to restless legs. He is frustrated with his weight, which affects his ability to undergo knee surgery.  Labs    CHOL 208 (H) 05/01/2024     HDL 45 05/01/2024    LDLCALC 150 (H) 05/01/2024    TRIG 65 05/01/2024    CHOLHDL 4.6 05/01/2024   Lab Results  Component Value Date   CHOL 146 08/25/2024   HDL 60 08/25/2024   LDLCALC 75 08/25/2024   TRIG 52 08/25/2024   CHOLHDL 2.4 08/25/2024   Lp(a)  05/01/2024 08:51 AM 22.0 <75.0 nmol/L Final    Recent Labs    05/01/24 0300 05/01/24 0851 05/02/24 1015 05/12/24 1014  NA 134* 138 137 140  K 3.7 4.3 4.0 4.4  CL 98 103 100 103  CO2 26 25 30 21   GLUCOSE 152* 127* 98 92  BUN 10 7* 9 14  CREATININE 1.14 1.05 1.17 0.96  CALCIUM  9.0 9.0 8.9 9.0  GFRNONAA >60 >60 >60  --     Lab Results  Component Value Date   ALT 36 05/01/2024   AST 28 05/01/2024   ALKPHOS 69 05/01/2024   BILITOT 0.5 05/01/2024  Latest Ref Rng & Units 06/16/2024    2:12 PM 05/12/2024   10:14 AM 05/02/2024   10:15 AM  CBC  WBC 4.0 - 10.5 K/uL 9.6  12.7  12.5   Hemoglobin 13.0 - 17.0 g/dL 85.8  85.1  84.8   Hematocrit 39.0 - 52.0 % 42.5  45.2  46.6   Platelets 150 - 400 K/uL 226  254  213    Lab Results  Component Value Date   HGBA1C 6.0 (H) 05/01/2024    Lab Results  Component Value Date   TSH 1.080 05/12/2024    ROS  Review of Systems  Cardiovascular:  Negative for chest pain, dyspnea on exertion and leg swelling.  Musculoskeletal:  Positive for arthritis and back pain.   Physical Exam:   VS:   BP (!) 153/89 (BP Location: Left Arm, Patient Position: Sitting, Cuff Size: Large)   Pulse 67   Resp 16   Ht 5' 8 (1.727 m)   Wt 264 lb 12.8 oz (120.1 kg)   SpO2 94%   BMI 40.26 kg/m    Wt Readings from Last 3 Encounters:  08/25/24 264 lb 12.8 oz (120.1 kg)  06/29/24 253 lb 9.6 oz (115 kg)  06/17/24 254 lb 9.6 oz (115.5 kg)    BP Readings from Last 3 Encounters:  08/25/24 (!) 153/89  06/29/24 124/70  06/17/24 122/67   Physical Exam Constitutional:      Appearance: He is obese.  Neck:     Vascular: No carotid bruit or JVD.  Cardiovascular:     Rate and Rhythm: Normal rate and regular rhythm.     Pulses: Intact distal pulses.     Heart sounds: Normal heart sounds. No murmur heard.    No gallop.  Pulmonary:     Effort: Pulmonary effort is normal.     Breath sounds: Normal breath sounds.  Abdominal:     General: Bowel sounds are normal.     Palpations: Abdomen is soft.  Musculoskeletal:     Right lower leg: No edema.     Left lower leg: No edema.    EKG:       Sinus rhythm  @ 66/min with Premature atrial complexes in a pattern of bigeminy Left axis deviation, Inferior infarct old.   ASSESSMENT AND PLAN: .      ICD-10-CM   1. Coronary artery disease involving native coronary artery of native heart without angina pectoris  I25.10 prasugrel  (EFFIENT ) 10 MG TABS tablet    aspirin  (ASPIRIN  CHILDRENS) 81 MG chewable tablet    ezetimibe  (ZETIA ) 10 MG tablet    2. Essential hypertension  I10 olmesartan -hydrochlorothiazide (BENICAR  HCT) 40-12.5 MG tablet    amLODipine  (NORVASC ) 5 MG tablet    3. Hyperlipidemia LDL goal <70  E78.5 Lipid panel    ezetimibe  (ZETIA ) 10 MG tablet    4. PAF (paroxysmal atrial fibrillation) (HCC)  I48.0 LONG TERM MONITOR (3-14 DAYS)     Assessment & Plan Atrial fibrillation, status post cardioversion and anticoagulation management Atrial fibrillation occurred during a myocardial infarction, followed by cardioversion on 04/11/2024. He was  initially on Pradaxa  but discontinued due to cost and access issues. Currently on clopidogrel  but should transition to Effient  and aspirin  for optimal DAPT management for 6 months until June 2026. - Discontinue clopidogrel . - Start Effient  10 mg once daily. - Start aspirin  81 mg once daily. - Suspect his atrial fibrillation was related to acute ischemia and occurred while I was doing PCI.  At least  clinically he has not had any recurrence of atrial fibrillation. - Will place Zio patch monitoring today to evaluate for PAF in view of hypertension, age and obesity is a risk factor.  He also has poor sleeping habits in view of restless leg and cannot completely exclude OSA.  Atherosclerotic heart disease of native coronary artery, status post stent placement Status post stent placement following myocardial infarction. Requires management of cardiovascular risk factors, including hypertension and weight, to prevent further cardiac events. Surgery is contraindicated for six months post-myocardial infarction due to increased risk of stent closure or recurrent myocardial infarction during surgery. - Educate on lifestyle modifications to manage cardiovascular risk factors.  Essential hypertension, uncontrolled Blood pressure remains uncontrolled despite current medication regimen, necessitating adjustments in antihypertensive therapy. - Discontinue losartan . - Start omisartan HCT 40/12.5 mg once daily. - Start amlodipine  5 mg once daily. - Continue metoprolol  succinate 50 mg once daily. - Monitor blood pressure and report if it remains above 130/80 mmHg. - Educate on potential side effect of amlodipine  causing leg swelling; advise to stop and report if swelling occurs.  Obesity Obesity contributes to difficulty in managing cardiovascular health and is a barrier to knee surgery. Weight management is crucial for improving overall health and surgical outcomes. - Encourage dietary modifications and weight  loss efforts.  Restless leg syndrome Restless leg syndrome affects sleep quality and may contribute to increased cardiovascular risk. He has a family history of restless leg syndrome and has not seen a neurologist recently. - Consider referral to a neurologist for evaluation and management of restless leg syndrome. - Discuss potential impact of restless leg syndrome on cardiovascular health.  Follow up: 3 months for CAD, hypertension, hypercholesterolemia.  Lipids reviewed, will add Zetia  10 mg daily.  On his next visit we can recheck his lipids.  Signed,  Gordy Bergamo, MD, [facility] 08/26/2024, 9:15 AM [facility] [address] [place], [place] [address] Phone: [phone]. Fax:  [phone]  "

## 2024-08-26 ENCOUNTER — Ambulatory Visit: Payer: Self-pay | Admitting: Cardiology

## 2024-08-26 LAB — LIPID PANEL
Chol/HDL Ratio: 2.4 ratio (ref 0.0–5.0)
Cholesterol, Total: 146 mg/dL (ref 100–199)
HDL: 60 mg/dL (ref >39)
LDL Chol Calc (NIH): 75 mg/dL (ref 0–99)
Triglycerides: 52 mg/dL (ref 0–149)
VLDL Cholesterol Cal: 11 mg/dL (ref 5–40)

## 2024-08-26 MED ORDER — EZETIMIBE 10 MG PO TABS: 10.0000 mg | ORAL_TABLET | Freq: Every day | ORAL | 3 refills | Status: AC

## 2024-08-26 NOTE — Addendum Note (Signed)
 Addended by: LADONA MILAN on: 08/26/2024 09:15 AM   Modules accepted: Orders

## 2024-09-23 DIAGNOSIS — I480 Paroxysmal atrial fibrillation: Secondary | ICD-10-CM

## 2024-09-24 NOTE — Progress Notes (Signed)
 No further atrial fibrillation. No change in therapy needed. Recent revascularization and LVEF is normal, hence no change in therapy with regards to NSVT. Brief AT

## 2024-10-12 ENCOUNTER — Telehealth (HOSPITAL_BASED_OUTPATIENT_CLINIC_OR_DEPARTMENT_OTHER): Payer: Self-pay | Admitting: *Deleted

## 2024-10-12 NOTE — Telephone Encounter (Signed)
   Pre-operative Risk Assessment    Patient Name: Lynell Kussman  DOB: 1961-03-10 MRN: [id number]   Date of last office visit: 08/25/24 DR. LADONA Date of next office visit: 11/25/24 DR. GANJI   Request for Surgical Clearance    Procedure:  LEFT TOTAL KNEE REPLACEMENT  Date of Surgery:  Clearance TBD                                Surgeon:  DR. REYES BILLING  Surgeon's Group or Practice Name:  [facility] Phone number:  [phone] ATTN: KATHERYN DUSTMAN Fax number:  [phone]   Type of Clearance Requested:   - Medical  - Pharmacy:  Hold Clopidogrel  (Plavix ) PER NOTES ON FORM: ( ONCE PAST THE 6 MONTH MARK CAN PT STOP PLAVIX  PRIOR TO SURGERY)   Type of Anesthesia:  Not Indicated (GENERAL?)   Additional requests/questions:    Bonney Niels Jest   10/12/2024, 1:15 PM

## 2024-10-12 NOTE — Telephone Encounter (Signed)
 Prefer patient to postpone total knee replacement until June 2026 however if he is unable to identify if he is in severe distress, he can be taken up for surgery with low risk, hold Plavix  for 5 days, continue aspirin  81 mg periprocedurally and start Plavix  back at the earliest.

## 2024-10-13 NOTE — Telephone Encounter (Signed)
   Name: Wesley Davenport  DOB: [birth year]  MRN: [id number]   Primary Cardiologist: Gordy Bergamo, MD  Chart reviewed as part of pre-operative protocol coverage.   Pr recently seen by Dr. Bergamo  Prefer patient to postpone total knee replacement until June 2026 however if he is unable to identify if he is in severe distress, he can be taken up for surgery with low risk, hold Plavix  for 5 days, continue aspirin  81 mg periprocedurally and start Plavix  back at the earliest.   I will route this recommendation to the requesting party via Epic fax function and remove from pre-op pool. Please call with questions.  Jon Garre Murel Shenberger, PA 10/13/2024, 8:18 AM

## 2024-10-26 ENCOUNTER — Encounter: Payer: Self-pay | Admitting: Family Medicine

## 2024-10-26 ENCOUNTER — Ambulatory Visit: Admitting: Family Medicine

## 2024-10-26 DIAGNOSIS — G2581 Restless legs syndrome: Secondary | ICD-10-CM

## 2024-10-26 MED ORDER — PRAMIPEXOLE DIHYDROCHLORIDE 1.5 MG PO TABS: 1.5000 mg | ORAL_TABLET | Freq: Three times a day (TID) | ORAL | 3 refills | Status: AC

## 2024-10-26 NOTE — Progress Notes (Signed)
 Subjective:  Patient ID: Wesley Davenport, male    DOB: 05/18/1961  Age: 63 y.o. MRN: [id number]  CC: surgical clearnace (Upcoming apt for cardiology clearance as well. )   HPI  Discussed the use of AI scribe software for clinical note transcription with the patient, who gave verbal consent to proceed.  History of Present Illness Wesley Davenport is a 63 year old male with bilateral knee osteoarthritis and coronary artery disease who presents for evaluation of knee pain and surgical planning.  He experiences severe bilateral knee pain, which has progressed to a 'bone on bone' condition with bone spurs, significantly impacting his mobility and quality of life. He is almost reliant on a walker. He is considering knee replacement surgery, initially planned for one knee followed by the other, but the surgery was postponed.  He has explored non-surgical options such as hyaluronate injections, which could delay surgery by at least three months. He was unable to receive these injections earlier due to insurance issues but will be eligible for coverage with [facility] insurance starting January. He is considering a series of three injections, spaced a week apart, to manage pain until surgery is feasible.  He has a history of coronary artery disease and has a stent placed. He is currently on blood thinners, including Prasugrel  and aspirin , to prevent stent thrombosis. He is not taking Eliquis . No excessive bleeding despite being on blood thinners.  He is retired and does not have short-term disability insurance.          06/17/2024    2:14 PM 06/16/2024    1:25 PM 07/03/2023   10:36 AM  Depression screen PHQ 2/9  Decreased Interest 0 0 0  Down, Depressed, Hopeless 0 0 0  PHQ - 2 Score 0 0 0  Altered sleeping   3  Tired, decreased energy   0  Change in appetite   0  Feeling bad or failure about yourself    0  Trouble concentrating   0  Moving slowly or fidgety/restless   0  Suicidal thoughts   0   PHQ-9 Score   3   Difficult doing work/chores   Somewhat difficult     Data saved with a previous flowsheet row definition    History Wesley Davenport has a past medical history of Hyperlipidemia and Restless leg syndrome.   He has a past surgical history that includes Hemorrhoid surgery; Coronary/Graft Acute MI Revascularization (N/A, 05/01/2024); LEFT HEART CATH AND CORONARY ANGIOGRAPHY (N/A, 05/01/2024); and CARDIOVERSION (N/A, 05/01/2024).   His family history includes Cancer in his mother; Heart disease in his brother and father.He reports that he has quit smoking. His smoking use included cigarettes. He has never used smokeless tobacco. He reports current alcohol use. He reports that he does not use drugs.    ROS Review of Systems  Constitutional:  Negative for fever.  Respiratory:  Negative for shortness of breath.   Cardiovascular:  Negative for chest pain.  Musculoskeletal:  Positive for arthralgias.  Skin:  Negative for rash.    Objective:  BP 129/73   Pulse 67   Temp (!) 97.2 F (36.2 C)   Ht 5' 8 (1.727 m)   Wt 272 lb (123.4 kg)   SpO2 92%   BMI 41.36 kg/m   BP Readings from Last 3 Encounters:  10/26/24 129/73  08/25/24 (!) 153/89  06/29/24 124/70    Wt Readings from Last 3 Encounters:  10/26/24 272 lb (123.4 kg)  08/25/24 264 lb 12.8 oz (120.1  kg)  06/29/24 253 lb 9.6 oz (115 kg)     Physical Exam Vitals reviewed.  Constitutional:      Appearance: He is well-developed.  HENT:     Head: Normocephalic and atraumatic.     Right Ear: External ear normal.     Left Ear: External ear normal.     Mouth/Throat:     Pharynx: No oropharyngeal exudate or posterior oropharyngeal erythema.  Eyes:     Pupils: Pupils are equal, round, and reactive to light.  Cardiovascular:     Rate and Rhythm: Normal rate and regular rhythm.     Heart sounds: No murmur heard. Pulmonary:     Effort: No respiratory distress.     Breath sounds: Normal breath sounds.  Musculoskeletal:      Cervical back: Normal range of motion and neck supple.  Neurological:     Mental Status: He is alert and oriented to person, place, and time.    Physical Exam GENERAL: Alert, cooperative, well developed, no acute distress HEENT: Normocephalic, normal oropharynx, moist mucous membranes CHEST: Clear to auscultation bilaterally, No wheezes, rhonchi, or crackles CARDIOVASCULAR: Normal heart rate and rhythm, S1 and S2 normal without murmurs ABDOMEN: Soft, non-tender, non-distended, without organomegaly, Normal bowel sounds EXTREMITIES: No cyanosis or edema NEUROLOGICAL: Cranial nerves grossly intact, Moves all extremities without gross motor or sensory deficit   Assessment & Plan:  Restless leg syndrome, controlled -     Pramipexole  Dihydrochloride; Take 1 tablet (1.5 mg total) by mouth 3 (three) times daily.  Dispense: 270 tablet; Refill: 3    Assessment and Plan Assessment & Plan Bilateral knee osteoarthritis   Severe bilateral knee osteoarthritis with bone-on-bone contact and spurs causes significant pain and mobility issues. Surgery is planned but delayed due to cardiac concerns. Injections pose a bleeding risk due to Prasugrel  and aspirin  use. Surgery depends on cardiac clearance, particularly regarding anticoagulation therapy. Consult with cardiologist Dr. Ladona for cardiac clearance. If cleared, proceed with left knee surgery. If not, discuss gel injections with the orthopedist, ensuring awareness of the plan. Avoid joint injections while on Prasugrel  and aspirin .  Coronary artery disease with stable angina   Coronary artery disease with stable angina is managed with Prasugrel  and aspirin . Recent stent placement necessitates continued anticoagulation therapy. Cardiologist's clearance is required for knee surgery due to cardiac status and anticoagulation therapy. He is not on Eliquis , which is preferred for atrial fibrillation but not needed currently. Continue Prasugrel  and aspirin   as prescribed. Consult with cardiologist Dr. Ganji for clearance for knee surgery, considering anticoagulation therapy.     32 min. Spent with patient More than 1/2 in consultation regarding his options regarding treatment for his knee arthritis pain.  Follow-up: Return in about 6 months (around 04/26/2025).  Butler Der, M.D.

## 2024-10-26 NOTE — Patient Instructions (Signed)
°  VISIT SUMMARY: Today, we discussed your severe knee pain due to osteoarthritis and the potential for knee replacement surgery. We also reviewed your coronary artery disease and the need for cardiac clearance before proceeding with any surgical plans.  YOUR PLAN: BILATERAL KNEE OSTEOARTHRITIS: You have severe osteoarthritis in both knees, causing significant pain and mobility issues. Surgery is planned but delayed due to your heart condition. -Consult with your cardiologist, Dr. Ladona, to get clearance for knee surgery, especially regarding your blood thinner medications. -If you get cardiac clearance, we will proceed with surgery on your left knee first. -If you do not get clearance, we will discuss the option of hyaluronate injections with your orthopedist to manage your pain until surgery is possible. -Avoid joint injections while you are taking Prasugrel  and aspirin .  CORONARY ARTERY DISEASE WITH STABLE ANGINA: You have coronary artery disease and are currently managing it with Prasugrel  and aspirin . -Continue taking Prasugrel  and aspirin  as prescribed. -Consult with your cardiologist, Dr. Ladona, to get clearance for knee surgery, considering your heart condition and blood thinner medications.                      Contains text generated by Abridge.                                 Contains text generated by Abridge.

## 2024-11-25 ENCOUNTER — Encounter: Payer: Self-pay | Admitting: Cardiology

## 2024-11-25 ENCOUNTER — Other Ambulatory Visit (HOSPITAL_COMMUNITY): Payer: Self-pay

## 2024-11-25 ENCOUNTER — Ambulatory Visit: Attending: Cardiology | Admitting: Cardiology

## 2024-11-25 VITALS — BP 98/70 | HR 70 | Ht 71.0 in | Wt 274.8 lb

## 2024-11-25 DIAGNOSIS — E785 Hyperlipidemia, unspecified: Secondary | ICD-10-CM | POA: Diagnosis not present

## 2024-11-25 DIAGNOSIS — I2510 Atherosclerotic heart disease of native coronary artery without angina pectoris: Secondary | ICD-10-CM

## 2024-11-25 DIAGNOSIS — I10 Essential (primary) hypertension: Secondary | ICD-10-CM

## 2024-11-25 LAB — LIPID PANEL WITH LDL/HDL RATIO

## 2024-11-25 MED ORDER — AMLODIPINE BESYLATE 5 MG PO TABS
5.0000 mg | ORAL_TABLET | Freq: Every day | ORAL | 3 refills | Status: DC
  Filled 2024-11-25: qty 90, 90d supply, fill #0

## 2024-11-25 MED ORDER — AMLODIPINE BESYLATE 5 MG PO TABS
5.0000 mg | ORAL_TABLET | Freq: Every day | ORAL | 3 refills | Status: AC
  Filled 2024-11-25: qty 90, 90d supply, fill #0
  Filled ????-??-??: fill #0

## 2024-11-25 MED ORDER — PRASUGREL HCL 10 MG PO TABS
10.0000 mg | ORAL_TABLET | Freq: Every day | ORAL | 1 refills | Status: AC
  Filled 2024-11-25: qty 90, 90d supply, fill #0
  Filled ????-??-??: fill #0

## 2024-11-25 MED ORDER — METOPROLOL SUCCINATE ER 50 MG PO TB24
50.0000 mg | ORAL_TABLET | Freq: Every day | ORAL | 3 refills | Status: DC
  Filled 2024-11-25: qty 90, 90d supply, fill #0

## 2024-11-25 MED ORDER — PRASUGREL HCL 10 MG PO TABS
10.0000 mg | ORAL_TABLET | Freq: Every day | ORAL | 1 refills | Status: DC
  Filled 2024-11-25: qty 90, 90d supply, fill #0

## 2024-11-25 MED ORDER — METOPROLOL SUCCINATE ER 50 MG PO TB24
50.0000 mg | ORAL_TABLET | Freq: Every day | ORAL | 3 refills | Status: AC
  Filled 2024-11-25: qty 90, 90d supply, fill #0
  Filled ????-??-??: fill #0

## 2024-11-25 NOTE — Patient Instructions (Addendum)
 Medication Instructions:  START  amLODipine  (NORVASC ) 5 MG tablet        Take 1 tablet (5 mg total) by mouth daily.    metoprolol  succinate (TOPROL -XL) 50 MG 24 hr tablet         Take 1 tablet (50 mg total) by mouth daily   prasugrel  (EFFIENT ) 10 MG TABS tablet         Take 1 tablet (10 mg total) by mouth daily.    *If you need a refill on your cardiac medications before your next appointment, please call your pharmacy*  Lab Work: (TODAY) Lab Orders         Lipid Panel With LDL/HDL Ratio         CMP14+EGFR         CBC     If you have labs (blood work) drawn today and your tests are completely normal, you will receive your results only by: MyChart Message (if you have MyChart) OR A paper copy in the mail If you have any lab test that is abnormal or we need to change your treatment, we will call you to review the results.  Follow-Up: At [facility], you and your health needs are our priority.  As part of our continuing mission to provide you with exceptional heart care, our providers are all part of one team.  This team includes your primary Cardiologist (physician) and Advanced Practice Providers or APPs (Physician Assistants and Nurse Practitioners) who all work together to provide you with the care you need, when you need it.  Your next appointment:   6 month(s)  Provider:   Gordy Bergamo, MD    We recommend signing up for the patient portal called MyChart.  Sign up information is provided on this After Visit Summary.  MyChart is used to connect with patients for Virtual Visits (Telemedicine).  Patients are able to view lab/test results, encounter notes, upcoming appointments, etc.  Non-urgent messages can be sent to your provider as well.   To learn more about what you can do with MyChart, go to [email or web address].

## 2024-11-25 NOTE — Progress Notes (Signed)
 " Cardiology Office Note:  .   Date:  11/25/2024  ID:  Wesley Davenport, DOB 08-04-1961, MRN [id number] PCP: Zollie Lowers, MD  [facility] Providers Cardiologist:  Gordy Bergamo, MD   History of Present Illness: .   Wesley Davenport is a 64 y.o. Caucasian male patient presenting with inferior STEMI on 05/01/24 SP proximal RCA DES, cardioversion for atrial fibrillation with RVR during PCI but persisted with A-fib hence was started on amiodarone  and underwent successful cardioversion on in the ICU the same day discharged on Eliquis  with plans on stopping anticoagulation if there is no recurrence of A-fib on event monitor.  This is a 27-month office visit.  He quit smoking cigarettes 1 week prior to his STEMI and also has pure hypercholesterolemia.   He was seen in our clinic on 06/29/2024 and Eliquis  was switched to Pradaxa  in view of insurance needs however patient discontinued taking anticoagulation greater than a month before that and was only on Plavix  to which I added ASA 81 mg daily. He is asymptomatic.     Discussed the use of AI scribe software for clinical note transcription with the patient, who gave verbal consent to proceed.  History of Present Illness Wesley Davenport is a 64 year old male with coronary artery disease who presents for follow-up regarding his heart health and knee pain management. He is accompanied by his wife.  He has intermittent sharp abdominal pain starting 20-30 minutes after eating and lasting 5-10 minutes, which he feels is gas-related. He has no chest pain or heart racing with physical activity such as yard work.  He has coronary artery disease and had a heart attack in June 2025. He takes prasugrel  and aspirin . His blood pressure is controlled on amlodipine  5 mg daily, metoprolol  succinate 50 mg daily, and olmesartan  HCT 40/12.5 mg daily. For cholesterol he takes atorvastatin  and Zetia .  He has severe knee pain from osteoarthritis that limits walking and standing  and has led to weight gain and reduced physical activity.  He has atrial fibrillation.  Cardiac Studies relevent.     Cardiac Catheterization 05/01/24:    proximal RCA 3.5 x 20 mm Synergy XD DES      ECHOCARDIOGRAM COMPLETE 05/02/2024  Normal LV systolic function, EF 55 to 60% with inferior wall hypokinesis. No significant valvular abnormality.  Event monitor 5 days 08/25/2024 for PAF: Minimum heart rate 43 bpm at 5:45 AM, maximum heart rate 105 bpm at 1:22 AM, average heartbeat 71 bpm.  Predominant underlying rhythm was sinus rhythm. Therefore NSVT episodes, longest 17 seconds at the rate of 117 bpm.  Longest episode at 12:16 PM on 08/28/2024.  Cannot exclude longest NSVT episode to be atrial tachycardia with 2: 1 conduction with aberrancy.  Brief episodes of 5-6 beats NSVT present Occasional PVCs and rare ventricular couplets were present.  Rare ventricular trigeminy present, longest 15 seconds. 3 SVT episodes occurred, longest 10 beats, EKG reveals brief atrial tachycardia. There are frequent PACs, 6% burden.  Occasional atrial couplets and triplets were present. There was no atrial fibrillation, no high degree AV block, no symptoms reported.   NSVT:    NSVT vs AT with abberancy:   EKG:      Labs   Lab Results  Component Value Date   CHOL 146 08/25/2024   HDL 60 08/25/2024   LDLCALC 75 08/25/2024   TRIG 52 08/25/2024   CHOLHDL 2.4 08/25/2024   Lipoprotein (a)  Date/Time Value Ref Range Status  05/01/2024 08:51 AM 22.0 <  75.0 nmol/L Final    Comment:    (NOTE) This test was developed and its performance characteristics determined by [facility]. It has not been cleared or approved by the Food and Drug Administration. Note:  Values greater than or equal to 75.0 nmol/L may       indicate an independent risk factor for CHD,       but must be evaluated with caution when applied       to non-Caucasian populations due to the       influence of genetic factors on Lp(a)  across       ethnicities. Performed At: [facility] [address] [place], [place] [id number] Jennette Shorter MD Ey:[id number]     Recent Labs    05/01/24 0300 05/01/24 0851 05/02/24 1015 05/12/24 1014  NA 134* 138 137 140  K 3.7 4.3 4.0 4.4  CL 98 103 100 103  CO2 26 25 30 21   GLUCOSE 152* 127* 98 92  BUN 10 7* 9 14  CREATININE 1.14 1.05 1.17 0.96  CALCIUM  9.0 9.0 8.9 9.0  GFRNONAA >60 >60 >60  --     Lab Results  Component Value Date   ALT 36 05/01/2024   AST 28 05/01/2024   ALKPHOS 69 05/01/2024   BILITOT 0.5 05/01/2024      Latest Ref Rng & Units 06/16/2024    2:12 PM 05/12/2024   10:14 AM 05/02/2024   10:15 AM  CBC  WBC 4.0 - 10.5 K/uL 9.6  12.7  12.5   Hemoglobin 13.0 - 17.0 g/dL 85.8  85.1  84.8   Hematocrit 39.0 - 52.0 % 42.5  45.2  46.6   Platelets 150 - 400 K/uL 226  254  213    Lab Results  Component Value Date   HGBA1C 6.0 (H) 05/01/2024    Lab Results  Component Value Date   TSH 1.080 05/12/2024    ROS  Review of Systems  Cardiovascular:  Negative for chest pain, dyspnea on exertion and leg swelling.   Physical Exam:   VS:  BP 98/70 (BP Location: Right Arm, Patient Position: Sitting, Cuff Size: Large)   Pulse 70   Ht 5' 11 (1.803 m)   Wt 274 lb 12.8 oz (124.6 kg)   SpO2 95%   BMI 38.33 kg/m    Wt Readings from Last 3 Encounters:  11/25/24 274 lb 12.8 oz (124.6 kg)  10/26/24 272 lb (123.4 kg)  08/25/24 264 lb 12.8 oz (120.1 kg)    BP Readings from Last 3 Encounters:  11/25/24 98/70  10/26/24 129/73  08/25/24 (!) 153/89   Physical Exam Constitutional:      Appearance: He is morbidly obese.  Neck:     Vascular: No carotid bruit or JVD.  Cardiovascular:     Rate and Rhythm: Normal rate and regular rhythm.     Pulses: Intact distal pulses.     Heart sounds: Normal heart sounds. No murmur heard.    No gallop.  Pulmonary:     Effort: Pulmonary effort is normal.     Breath sounds: Normal breath sounds.  Abdominal:      General: Bowel sounds are normal.     Palpations: Abdomen is soft.  Musculoskeletal:     Right lower leg: No edema.     Left lower leg: No edema.     ASSESSMENT AND PLAN: .      ICD-10-CM   1. Coronary artery disease involving native coronary artery of native heart  without angina pectoris  I25.10 Lipid Panel With LDL/HDL Ratio    CMP14+EGFR    CBC    metoprolol  succinate (TOPROL -XL) 50 MG 24 hr tablet    prasugrel  (EFFIENT ) 10 MG TABS tablet    DISCONTINUED: metoprolol  succinate (TOPROL -XL) 50 MG 24 hr tablet    DISCONTINUED: prasugrel  (EFFIENT ) 10 MG TABS tablet    2. Essential hypertension  I10 amLODipine  (NORVASC ) 5 MG tablet    DISCONTINUED: amLODipine  (NORVASC ) 5 MG tablet    3. Hyperlipidemia LDL goal <70  E78.5 Lipid Panel With LDL/HDL Ratio     Assessment & Plan Coronary artery disease status post myocardial infarction and stent placement 05/01/2024 Coronary artery disease is well-managed with no recent chest pain. Blood pressure is well-controlled with current medications. Prasugrel  is required for one year post-stent placement, with a transition to Plavix  planned in six months. - Continue prasugrel  and aspirin  for six months - Will transition to Plavix  alone in six months - Ordered blood work including cholesterol, CMP, and CBC  Essential hypertension Blood pressure is well-controlled with current medication regimen. - Continue current antihypertensive medications: amlodipine , metoprolol , and olmesartan /HCT  Hyperlipidemia Cholesterol levels needs to be checked with the addition of Zetia  to atorvastatin . - Continue atorvastatin  80 mg daily and Zetia  - Ordered cholesterol panel  Knee osteoarthritis Causing significant pain and limiting physical activity. He is considering knee replacement surgery but is currently unable to undergo surgery due to ongoing anticoagulation therapy. Injections are an option but do not preclude future surgery. Weight loss is emphasized to  improve knee function and surgical candidacy. - Consider knee injections for temporary relief - Encouraged weight loss to improve knee function and surgical candidacy - Recommended low-impact exercises such as stationary bicycle, elliptical, and water walking  Obesity Contributing to knee pain and limiting physical activity. Weight loss is crucial for improving overall health and knee function. Each pound lost reduces knee stress by six pounds. - Encouraged weight loss through diet and exercise - Recommended low-impact exercises to aid weight loss  Non-sustained ventricular tachycardia Noted on recent monitor, but not concerning due to recent revascularization and normal LVEF. - Continue monitoring for any symptoms or changes  Follow up: 6 months for CAD, Hyperlipidemia and Change to Plavix  single agent  Signed,  Gordy Bergamo, MD, [facility] 11/25/2024, 11:23 AM [facility] [address] [place], [place] [address] Phone: [phone]. Fax:  [phone]  "

## 2024-11-26 ENCOUNTER — Ambulatory Visit: Payer: Self-pay | Admitting: Cardiology

## 2024-11-26 LAB — CBC
Hematocrit: 42.9 % (ref 37.5–51.0)
Hemoglobin: 14.1 g/dL (ref 13.0–17.7)
MCH: 30.3 pg (ref 26.6–33.0)
MCHC: 32.9 g/dL (ref 31.5–35.7)
MCV: 92 fL (ref 79–97)
Platelets: 292 x10E3/uL (ref 150–450)
RBC: 4.66 x10E6/uL (ref 4.14–5.80)
RDW: 13.7 % (ref 11.6–15.4)
WBC: 10.6 x10E3/uL (ref 3.4–10.8)

## 2024-11-26 LAB — CMP14+EGFR
ALT: 40 IU/L (ref 0–44)
AST: 27 IU/L (ref 0–40)
Albumin: 4.3 g/dL (ref 3.9–4.9)
Alkaline Phosphatase: 86 IU/L (ref 47–123)
BUN/Creatinine Ratio: 15 (ref 10–24)
BUN: 17 mg/dL (ref 8–27)
Bilirubin Total: 0.4 mg/dL (ref 0.0–1.2)
CO2: 24 mmol/L (ref 20–29)
Calcium: 9.2 mg/dL (ref 8.6–10.2)
Chloride: 100 mmol/L (ref 96–106)
Creatinine, Ser: 1.15 mg/dL (ref 0.76–1.27)
Globulin, Total: 2.5 g/dL (ref 1.5–4.5)
Glucose: 104 mg/dL — AB (ref 70–99)
Potassium: 4.5 mmol/L (ref 3.5–5.2)
Sodium: 140 mmol/L (ref 134–144)
Total Protein: 6.8 g/dL (ref 6.0–8.5)
eGFR: 71 mL/min/1.73

## 2024-11-26 LAB — LIPID PANEL WITH LDL/HDL RATIO
Cholesterol, Total: 116 mg/dL (ref 100–199)
HDL: 47 mg/dL
LDL Chol Calc (NIH): 57 mg/dL (ref 0–99)
LDL/HDL Ratio: 1.2 ratio (ref 0.0–3.6)
Triglycerides: 49 mg/dL (ref 0–149)
VLDL Cholesterol Cal: 12 mg/dL (ref 5–40)

## 2024-11-29 NOTE — Progress Notes (Signed)
 Your labs are under excellent control with regard to your cholesterol, blood counts are normal, kidney function is normal. All the labs are within normal limits.

## 2024-11-30 ENCOUNTER — Telehealth: Payer: Self-pay | Admitting: Family Medicine

## 2024-11-30 NOTE — Telephone Encounter (Signed)
 Patient returned staff call regarding results.

## 2024-11-30 NOTE — Telephone Encounter (Signed)
 Copied from CRM [id number]. Topic: Clinical - Request for Lab/Test Order >> Nov 30, 2024 11:57 AM Myrick T wrote: Reason for CRM: patients wife called to have the form that was sent from Dr Baird from [facility] for surgery as soon as possible. The fax and phone number is on the form. Please f/u with patient

## 2024-11-30 NOTE — Telephone Encounter (Signed)
 NO

## 2024-12-01 NOTE — Telephone Encounter (Signed)
 Pt is calling to check on clearance. He is in a lot of pain and wants to have surgery done asap. Please advise.

## 2024-12-01 NOTE — Telephone Encounter (Signed)
 Talked w/ wife that I called [facility] they did not see any notes in pts chart w/ them that they needed anything from pt's PCP. Wife asked if I talked w/ Katheryn which I did not I only talked w/ the person that answered the phone and looked through his chart. Wife will get in contact w/ Katheryn and give her my name direct ph# and fax # if Dr. Cristi office needs anything else.

## 2024-12-02 NOTE — Telephone Encounter (Signed)
 Marlo Kand routed this conversation to Cv Div Preop Callback  (Selected Message) Kirby, Jada JK   12/02/24  4:13 PM Note Pt wife called in for update on forms. Please advise.        12/02/24  4:11 PM Ellender Odor (Emergency Contact) contacted Marlo Kand Ina Dorothyann JONETTA, LPN    8/78/[birth year] 88:46 AM Note Talked w/ wife that I called [facility] they did not see any notes in pts chart w/ them that they needed anything from pt's PCP. Wife asked if I talked w/ Katheryn which I did not I only talked w/ the person that answered the phone and looked through his chart. Wife will get in contact w/ Katheryn and give her my name direct ph# and fax # if Dr. Cristi office needs anything else.      12/01/24 11:47 AM Ina Dorothyann JONETTA, LPN contacted [facility] (Emergency Contact)    11/30/24  4:49 PM Sizemore, Rea SQUIBB, CMA routed this conversation to Ina Dorothyann JONETTA, LPN   Zollie Lowers, MD to [facility] Clinical     11/30/24  4:39 PM Note NO     Rostosky, Harlene BROCKS, RMA to Zollie Lowers, MD     11/30/24  2:57 PM Have you seen this form?    11/30/24 12:07 PM Micheline Rosina FALCON, CMA routed this conversation to [facility]  Micheline Rosina FALCON [facility]   11/30/24 12:07 PM Note Copied from CRM #[id number]. Topic: Clinical - Request for Lab/Test Order >> Nov 30, 2024 11:57 AM Myrick T wrote: Reason for CRM: patients wife called to have the form that was sent from Dr Baird from [facility] for surgery as soon as possible. The fax and phone number is on the form. Please f/u with patient          11/30/24 11:55 AM Ellender Odor (Emergency Contact) contacted Sebastian Myrick

## 2024-12-02 NOTE — Telephone Encounter (Signed)
 I will send this to preop APP to review the notes from Jon Hails, [facility] from Dr. Ladona recent appt ; as well the phone note from the pt's call today.

## 2024-12-02 NOTE — Telephone Encounter (Signed)
 Pt wife called in for update on forms. Please advise.

## 2024-12-02 NOTE — Telephone Encounter (Signed)
 Dr. Ladona  You saw this patient on 11/25/24. Per office protocol, will you please comment on medical clearance for Left Knee Replacement?  I saw your note from before discussing Plavix . He has contacted the office stating he is in a lot of pain and would like the procedure done ASAP. Can you give medical clearance and recommendations regarding holding Effient ?  Please route your response to P [facility] DIV Preop. I will communicate with requesting office once you have given recommendations.   Thank you!  Mardy Pizza, NP

## 2024-12-06 ENCOUNTER — Encounter: Payer: Self-pay | Admitting: Cardiology

## 2024-12-07 NOTE — Telephone Encounter (Signed)
 I have already sent letter of clearance to Dr. Reyes Billing, and the letter I stated hold Plavix  but patient is aware he is taking prasugrel  and will hold for 5 days but start aspirin  81 mg daily periprocedurally.  Low risk.

## 2024-12-07 NOTE — Telephone Encounter (Signed)
"  ° °  Patient Name: Wesley Davenport  DOB: [birth year] MRN: [id number]  Primary Cardiologist: Gordy Bergamo, MD  Chart reviewed as part of pre-operative protocol coverage. Per Dr. Bergamo: I have already sent letter of clearance to Dr. Reyes Billing, and the letter I stated hold Plavix  but patient is aware he is taking prasugrel  and will hold for 5 days but start aspirin  81 mg daily periprocedurally.  Low risk.  I will route this recommendation to the requesting party via Epic fax function and remove from pre-op pool.  Please call with questions.  Mozel Burdett E Rohan Juenger, PA-C 12/07/2024, 7:14 PM  "

## 2024-12-09 ENCOUNTER — Telehealth: Payer: Self-pay | Admitting: Family Medicine

## 2024-12-09 NOTE — Telephone Encounter (Signed)
 [facility] FAXED SURGICAL CLEARANCE FORM AND PLACE ON NURSE DESK

## 2024-12-14 ENCOUNTER — Ambulatory Visit: Payer: Self-pay | Admitting: Orthopedic Surgery

## 2024-12-17 ENCOUNTER — Encounter: Payer: Self-pay | Admitting: *Deleted

## 2025-01-12 ENCOUNTER — Ambulatory Visit (HOSPITAL_COMMUNITY): Admit: 2025-01-12 | Admitting: Specialist

## 2025-04-21 ENCOUNTER — Ambulatory Visit: Admitting: Family Medicine

## 2025-04-26 ENCOUNTER — Ambulatory Visit: Admitting: Family Medicine
